# Patient Record
Sex: Female | Born: 1955
Health system: Southern US, Community
[De-identification: ages and names within clinical notes are randomized; demographics above are authoritative.]

## PROBLEM LIST (undated history)

## (undated) DIAGNOSIS — M1990 Unspecified osteoarthritis, unspecified site: Secondary | ICD-10-CM

## (undated) DIAGNOSIS — I639 Cerebral infarction, unspecified: Secondary | ICD-10-CM

## (undated) DIAGNOSIS — J449 Chronic obstructive pulmonary disease, unspecified: Secondary | ICD-10-CM

## (undated) HISTORY — PX: ECTOPIC PREGNANCY SURGERY: SHX613

## (undated) HISTORY — DX: Chronic obstructive pulmonary disease, unspecified: J44.9

## (undated) HISTORY — DX: Unspecified osteoarthritis, unspecified site: M19.90

---

## 1998-10-14 ENCOUNTER — Emergency Department (HOSPITAL_COMMUNITY): Admission: EM | Admit: 1998-10-14 | Discharge: 1998-10-14 | Payer: Self-pay | Admitting: Emergency Medicine

## 1998-10-14 ENCOUNTER — Encounter: Payer: Self-pay | Admitting: Emergency Medicine

## 1999-04-12 ENCOUNTER — Other Ambulatory Visit: Admission: RE | Admit: 1999-04-12 | Discharge: 1999-04-12 | Payer: Self-pay | Admitting: Obstetrics and Gynecology

## 1999-04-20 ENCOUNTER — Encounter: Payer: Self-pay | Admitting: Obstetrics and Gynecology

## 1999-04-23 ENCOUNTER — Ambulatory Visit (HOSPITAL_COMMUNITY): Admission: RE | Admit: 1999-04-23 | Discharge: 1999-04-23 | Payer: Self-pay | Admitting: Obstetrics and Gynecology

## 1999-04-30 ENCOUNTER — Ambulatory Visit (HOSPITAL_COMMUNITY): Admission: RE | Admit: 1999-04-30 | Discharge: 1999-04-30 | Payer: Self-pay | Admitting: Surgery

## 1999-04-30 ENCOUNTER — Encounter: Payer: Self-pay | Admitting: Obstetrics and Gynecology

## 2007-12-26 ENCOUNTER — Emergency Department (HOSPITAL_COMMUNITY): Admission: EM | Admit: 2007-12-26 | Discharge: 2007-12-26 | Payer: Self-pay | Admitting: Emergency Medicine

## 2010-06-19 ENCOUNTER — Emergency Department (HOSPITAL_COMMUNITY): Admission: EM | Admit: 2010-06-19 | Discharge: 2010-06-19 | Payer: Self-pay | Admitting: Emergency Medicine

## 2010-08-12 ENCOUNTER — Emergency Department (HOSPITAL_COMMUNITY): Admission: EM | Admit: 2010-08-12 | Discharge: 2010-08-12 | Payer: Self-pay | Admitting: Emergency Medicine

## 2011-01-11 LAB — WOUND CULTURE

## 2011-01-30 ENCOUNTER — Emergency Department (HOSPITAL_COMMUNITY): Payer: Medicaid Other

## 2011-01-30 ENCOUNTER — Emergency Department (HOSPITAL_COMMUNITY)
Admission: EM | Admit: 2011-01-30 | Discharge: 2011-01-30 | Disposition: A | Discharge status: Home / Self Care | Payer: Medicaid Other | Attending: Emergency Medicine | Admitting: Emergency Medicine

## 2011-01-30 DIAGNOSIS — M25579 Pain in unspecified ankle and joints of unspecified foot: Secondary | ICD-10-CM | POA: Insufficient documentation

## 2011-01-30 DIAGNOSIS — Z8679 Personal history of other diseases of the circulatory system: Secondary | ICD-10-CM | POA: Insufficient documentation

## 2011-01-30 DIAGNOSIS — W010XXA Fall on same level from slipping, tripping and stumbling without subsequent striking against object, initial encounter: Secondary | ICD-10-CM | POA: Insufficient documentation

## 2011-01-30 DIAGNOSIS — R079 Chest pain, unspecified: Secondary | ICD-10-CM | POA: Insufficient documentation

## 2011-01-30 DIAGNOSIS — S93409A Sprain of unspecified ligament of unspecified ankle, initial encounter: Secondary | ICD-10-CM | POA: Insufficient documentation

## 2011-09-09 ENCOUNTER — Emergency Department (HOSPITAL_COMMUNITY)
Admission: EM | Admit: 2011-09-09 | Discharge: 2011-09-09 | Disposition: A | Discharge status: Home / Self Care | Payer: Medicaid Other | Attending: Emergency Medicine | Admitting: Emergency Medicine

## 2011-09-09 DIAGNOSIS — M25529 Pain in unspecified elbow: Secondary | ICD-10-CM | POA: Insufficient documentation

## 2011-09-09 DIAGNOSIS — Z8673 Personal history of transient ischemic attack (TIA), and cerebral infarction without residual deficits: Secondary | ICD-10-CM | POA: Insufficient documentation

## 2011-09-09 DIAGNOSIS — M79609 Pain in unspecified limb: Secondary | ICD-10-CM | POA: Insufficient documentation

## 2011-09-09 DIAGNOSIS — R209 Unspecified disturbances of skin sensation: Secondary | ICD-10-CM | POA: Insufficient documentation

## 2011-09-09 DIAGNOSIS — Z79899 Other long term (current) drug therapy: Secondary | ICD-10-CM | POA: Insufficient documentation

## 2011-09-09 DIAGNOSIS — R202 Paresthesia of skin: Secondary | ICD-10-CM

## 2011-09-09 HISTORY — DX: Cerebral infarction, unspecified: I63.9

## 2011-09-09 MED ORDER — PREDNISONE 10 MG PO TABS: 20.0000 mg | ORAL_TABLET | Freq: Every day | ORAL | Status: AC

## 2011-09-09 MED ORDER — PREDNISONE 20 MG PO TABS
40.0000 mg | ORAL_TABLET | Freq: Once | ORAL | Status: AC
  Administered 2011-09-09: 40 mg via ORAL
  Filled 2011-09-09: qty 2

## 2011-09-09 NOTE — ED Provider Notes (Signed)
History     CSN: [id number] Arrival date & time: 09/09/2011  4:59 PM   First MD Initiated Contact with Patient 09/09/11 1749      Chief Complaint  Patient presents with  . Numbness    (Consider location/radiation/quality/duration/timing/severity/associated sxs/prior treatment) HPI Comments: Patient present today with episodic right middle finger numbness and tingling.  She states that this morning she awoke and her entire hand was numb and she had difficulty opening and closing her hand.  She states that she fell 1 week ago landing on her right elbow.  States symptoms started after that, has seen PCP but has not been taking anything.  Patient is a 55 y.o. female presenting with hand pain.  Hand Pain This is a new problem. The current episode started in the past 7 days. The problem occurs intermittently. The problem has been waxing and waning. Associated symptoms include numbness. Pertinent negatives include no abdominal pain, arthralgias, chest pain, congestion, diaphoresis, fever, headaches, joint swelling, nausea, sore throat, urinary symptoms, vertigo, vomiting or weakness. The symptoms are aggravated by nothing. She has tried nothing for the symptoms.    Past Medical History  Diagnosis Date  . Stroke     Past Surgical History  Procedure Date  . Ectopic pregnancy surgery     History reviewed. No pertinent family history.  History  Substance Use Topics  . Smoking status: Current Everyday Smoker -- 0.5 packs/day  . Smokeless tobacco: Not on file  . Alcohol Use: Yes    OB History    Grav Para Term Preterm Abortions TAB SAB Ect Mult Living                  Review of Systems  Constitutional: Negative.  Negative for fever and diaphoresis.  HENT: Negative.  Negative for congestion and sore throat.   Eyes: Negative.   Respiratory: Negative.   Cardiovascular: Negative.  Negative for chest pain.  Gastrointestinal: Negative.  Negative for nausea, vomiting and abdominal  pain.  Genitourinary: Negative.   Musculoskeletal: Negative.  Negative for joint swelling and arthralgias.  Skin: Negative.   Neurological: Positive for numbness. Negative for vertigo, weakness and headaches.  Hematological: Negative.   Psychiatric/Behavioral: Negative.     Allergies  Review of patient's allergies indicates no known allergies.  Home Medications   Current Outpatient Rx  Name Route Sig Dispense Refill  . BUPRENORPHINE HCL-NALOXONE HCL 8-2 MG SL SUBL Sublingual Place 1 tablet under the tongue 2 (two) times daily.      Marland Kitchen GABAPENTIN 300 MG PO CAPS Oral Take 300 mg by mouth 3 (three) times daily.        BP 108/71  Pulse 76  Temp(Src) 98.1 F (36.7 C) (Oral)  Resp 20  SpO2 97%  Physical Exam  Nursing note and vitals reviewed. Constitutional: She appears well-developed and well-nourished.  HENT:  Head: Normocephalic and atraumatic.  Mouth/Throat: Oropharynx is clear and moist.  Eyes: Pupils are equal, round, and reactive to light.  Neck: Normal range of motion. Neck supple. No spinous process tenderness and no muscular tenderness present.  Cardiovascular: Normal rate and regular rhythm.   Pulmonary/Chest: Effort normal and breath sounds normal.  Abdominal: Soft. Bowel sounds are normal. There is no tenderness.  Musculoskeletal:       Right elbow: She exhibits normal range of motion, no swelling and no deformity. tenderness found.  Neurological: She has normal strength. No sensory deficit.    ED Course  Procedures (including critical care time)  Labs  Reviewed - No data to display No results found.  Transient sensory changes   MDM  Patient here with right hand numbness and tingling s/p striking right elbow, as the symptoms are transient, feel may be nerve irritation.        Izola Price Rapelje, [place] 09/09/11 1816

## 2011-09-09 NOTE — ED Notes (Signed)
Patient presents with intermittant right arm numbness after falling on arm 2 weeks ago. Patient is a hemiplegic to left side.

## 2011-09-09 NOTE — ED Notes (Signed)
Pt to ED for eval of right hand numbness/tingling/weakness. Pt states that ever since she fell on her elbow last week she has had intermittent tingling to right middle finger. States today she had numbness/tingling to entire right hand and states it was difficult to close and open her hand. At this time pt is able to open and close fist without difficulty.

## 2011-09-11 NOTE — ED Provider Notes (Signed)
Evaluation and management procedures were performed by the PA/NP under my supervision/collaboration.    Felisa Bonier, MD 09/11/11 [phone]

## 2011-12-26 ENCOUNTER — Ambulatory Visit (INDEPENDENT_AMBULATORY_CARE_PROVIDER_SITE_OTHER): Payer: Medicaid Other | Admitting: Gastroenterology

## 2011-12-26 DIAGNOSIS — B182 Chronic viral hepatitis C: Secondary | ICD-10-CM

## 2011-12-27 LAB — CBC WITH DIFFERENTIAL/PLATELET
Basophils Absolute: 0 10*3/uL (ref 0.0–0.1)
Basophils Relative: 0 % (ref 0–1)
Eosinophils Absolute: 0.1 10*3/uL (ref 0.0–0.7)
Eosinophils Relative: 2 % (ref 0–5)
HCT: 38.2 % (ref 36.0–46.0)
Hemoglobin: 13.1 g/dL (ref 12.0–15.0)
Lymphocytes Relative: 23 % (ref 12–46)
Lymphs Abs: 1.6 10*3/uL (ref 0.7–4.0)
MCH: 29.6 pg (ref 26.0–34.0)
MCHC: 34.3 g/dL (ref 30.0–36.0)
MCV: 86.2 fL (ref 78.0–100.0)
Monocytes Absolute: 0.4 10*3/uL (ref 0.1–1.0)
Monocytes Relative: 6 % (ref 3–12)
Neutro Abs: 5 10*3/uL (ref 1.7–7.7)
Neutrophils Relative %: 69 % (ref 43–77)
Platelets: 141 10*3/uL — ABNORMAL LOW (ref 150–400)
RBC: 4.43 MIL/uL (ref 3.87–5.11)
RDW: 13 % (ref 11.5–15.5)
WBC: 7.2 10*3/uL (ref 4.0–10.5)

## 2011-12-27 LAB — AFP TUMOR MARKER: AFP-Tumor Marker: 6.7 ng/mL (ref 0.0–8.0)

## 2011-12-27 LAB — PROTIME-INR
INR: 1.12 (ref <1.50)
Prothrombin Time: 14.9 seconds (ref 11.6–15.2)

## 2011-12-27 LAB — TSH: TSH: 0.986 u[IU]/mL (ref 0.350–4.500)

## 2011-12-27 LAB — HEPATITIS A ANTIBODY, TOTAL: Hep A Total Ab: POSITIVE — AB

## 2011-12-27 LAB — HEPATITIS B SURFACE ANTIBODY,QUALITATIVE

## 2011-12-27 LAB — ANA: Anti Nuclear Antibody(ANA): NEGATIVE

## 2011-12-27 LAB — HEPATITIS B SURFACE ANTIGEN: Hepatitis B Surface Ag: NEGATIVE

## 2011-12-27 LAB — HEPATITIS B CORE ANTIBODY, TOTAL: Hep B Core Total Ab: POSITIVE — AB

## 2011-12-27 LAB — FERRITIN: Ferritin: 192 ng/mL (ref 10–291)

## 2011-12-28 LAB — COMPLETE METABOLIC PANEL WITH GFR
ALT: 75 U/L — ABNORMAL HIGH (ref 0–35)
AST: 97 U/L — ABNORMAL HIGH (ref 0–37)
Albumin: 4.1 g/dL (ref 3.5–5.2)
Alkaline Phosphatase: 68 U/L (ref 39–117)
BUN: 15 mg/dL (ref 6–23)
CO2: 24 mEq/L (ref 19–32)
Calcium: 9.1 mg/dL (ref 8.4–10.5)
Chloride: 104 mEq/L (ref 96–112)
Creat: 0.67 mg/dL (ref 0.50–1.10)
GFR, Est African American: >89 mL/min
GFR, Est Non African American: >89 mL/min
Glucose, Bld: 89 mg/dL (ref 70–99)
Potassium: 3.6 mEq/L (ref 3.5–5.3)
Sodium: 140 mEq/L (ref 135–145)
Total Bilirubin: 0.5 mg/dL (ref 0.3–1.2)
Total Protein: 7.5 g/dL (ref 6.0–8.3)

## 2011-12-28 LAB — IRON AND TIBC
%SAT: 43 % (ref 20–55)
Iron: 125 ug/dL (ref 42–145)
TIBC: 294 ug/dL (ref 250–470)
UIBC: 169 ug/dL (ref 125–400)

## 2012-01-01 LAB — HEPATITIS C GENOTYPE

## 2012-01-02 NOTE — Progress Notes (Signed)
Dana Knapp, Dana Knapp    MR#:  [id number]      DATE:  12/26/2011  DOB:  May 28, 1956    cc: Primary care physician:  Same Referring physician:  Simone Curia, MD, [facility], [address], [place], [place] [address], Fax [phone]    Reason for referral:  Hepatitis C antibody positive.   History:  The patient is a 56 year old woman who I have been asked to see in consultation, by Dr. Nedra Knapp, regarding a positive hepatitis C antibody.  According to the patient, she was first found to be hepatitis C positive sometime in 2011, while being followed by a physician in [facility]. She recalls being sent to a woman physician, which was possibly  Dr. Karena Knapp at [facility]. I do not have any records regarding this, but the patient reports she was told that there was no indication for treatment. Again, I have no records  regarding this. She cannot recall her genotype or being biopsied. She has now switched physicians to [facility], who want her seen here. There are no symptoms referable to her history  of hepatitis C, nor are there symptoms to suggest decompensated or cryoglobulin mediated disease.  With respect to risk factors for liver disease, she describes herself as a weekend drinker, perhaps drinking once per week, but none in the last 5 months. 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 She stopped using in October 2012, where she was started on Suboxone. She continues to follow up with Dr. Nedra Knapp for Suboxone and has drug screens through their office. She reports that because she has  been negative on a weekly basis, she is due to move to biweekly drug testing. There is no history of tattoos or unsterile body piercing or blood transfusion prior to 1992. Her family history is significant for  a brother who had hepatitis C and was possibly treated in our clinic. She has not been vaccinated  against hepatitis A or B.   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 This may have been during an episode drug use. There is no history of coronary disease, diabetes, dyslipidemia, dysthyroidism.   PAST SURGICAL HISTORY:  Ectopic pregnancy.   Past psychiatric history:  Denies.   CURRENT MEDICATIONS:  Neurontin dose unknown, Suboxone 8-2 mg Film b.i.d.    Allergies:  Denies.    Habits:  Smoking 3/4 pack cigarettes per day. Alcohol as above.    Family history:  As above.    Social history:  Disabled secondary to stroke. She is widowed without any children.   REVIEW OF SYSTEMS:  Significant for left hemiplegia. All 10 systems reviewed today on the review of systems form, which was signed and placed the chart. CES-D was not completed.   PHYSICAL EXAMINATION:   Constitutional:  Appears stated age. Vital signs: Height 63 inches, weight 124 pounds, blood pressure 143/95, pulse 71, temperature 97.5 Fahrenheit. Ears, nose, mouth, and throat: Unremarkable oropharynx. No  thyromegaly or neck masses. Chest: Resonant to percussion. Clear to auscultation.  CARDIOVASCULAR:  Normal S1, S2. No murmurs, rubs.   EXTREMITIES:  There is no peripheral edema.  ABDOMINAL EXAM:  Normal bowel sounds. No masses or tenderness. I could not appreciate liver edge or spleen tip. Lymphatics: No cervical or inguinal lymphadenopathy.   CENTRAL NERVOUS SYSTEM:  No asterixis on the right hand. There is left hemiplegia from the  previous stroke.   DERMATOLOGIC:  Anicteric. No palmar erythema. Eyes: Anicteric sclera. Pupils were equal reactive to light.   LABORATORY STUDIES:  From 08/15/2011; CBC, white count 4.8, hemoglobin 13.8, MCV 90.5, platelet count 101. Creatinine was 0.62, albumin 4.3, globulins 3.4, total bilirubin 0.4, AST of 63, ALT 85, ALP 79  On 08/26/2011; Her hepatitis C antibody was positive, the B surface antigen was negative.    On 08/22/2011; albumin 4.1, globulin 3.1, total bilirubin 0.7, ALP 88, AST 65, ALT 62.   On 08/15/2011, TSH 1.04.   ASSESSMENT:  The patient is a 56 year old woman with a history of a positive hepatitis C antibody. She is clinically and biochemically well compensated however, I note that her CBC obtained 08/15/2011, showed a  platelet count of 101,000. Will need to repeat her CBC to see if she has thrombocytopenia to suggest cirrhosis or not. Otherwise, I diddo not see any contraindication to treating her for hepatitis C.  In my discussion today with the patient, we discussed the need to genotype her. We discussed the significance of genotyping her. We discussed the biopsy for genotype 1. Discussed treatment with  pegylated interferon and ribavirin for all genotypes and the addition of a protease inhibitor for genotype 1. We discussed the specific system, constitutional, psychiatric side effects of therapy. I  discussed response rates. I discussed our treatment protocol. I discussed the risks of contagion. Having said all this, the patient remains interested in being treated.   plan:  1. Standard labs. 2. HCV genotype. 3. Test hepatitis B surface antigen, and for hepatitis A and B immunity. 4. If genotype 1, proceed with a liver biopsy in the coming weeks, and follow up thereafter. 5. Genotype 2 or 3, consider going directly to therapy. 6. Literature on hepatitis C given.            Dana Dare, MD   ADDENDUM Genotype 3a.     Hep A positive.  Hep B surf Ab indeterminate.  Will consider it positive.   Plts 141.  Will complete the prior authorization for Peg/RBV with Medicaid.  403 .[id number]  D:  Thu Feb 28 20:18:44 2013 ; T:  Thu Feb 28 21:59:06 2013  Job #:  [id number]

## 2012-01-16 ENCOUNTER — Telehealth: Payer: Self-pay | Admitting: Gastroenterology

## 2012-01-16 NOTE — Telephone Encounter (Signed)
Called to [facility] and left a message to call back about her Pegasys prior authorization.

## 2012-01-31 ENCOUNTER — Telehealth: Payer: Self-pay | Admitting: Gastroenterology

## 2012-01-31 NOTE — Telephone Encounter (Signed)
Faxed orders for Pegasys 180 mcg weekly/Ribavirin 400 mg BID for six months.  Medicaid authorized Pegasys on 01/20/12.

## 2012-02-13 ENCOUNTER — Telehealth: Payer: Self-pay | Admitting: Gastroenterology

## 2012-02-13 NOTE — Telephone Encounter (Signed)
Faxed to [facility] to ask about status of meds.  Also asking Tobi Bastos to call.

## 2012-02-18 ENCOUNTER — Telehealth: Payer: Self-pay

## 2012-02-20 ENCOUNTER — Telehealth: Payer: Self-pay | Admitting: Gastroenterology

## 2012-02-20 NOTE — Telephone Encounter (Signed)
When I asked Dana Knapp to call [facility] to investigate the status of Dana Knapp meds, she discovered that [name] had contacted the patient.  Dana Knapp's note: "I spoke with patient and she stated that she always has low grade fevers and flu like symtoms. She told this to [facility] drug and they stated that they will have to confirm this with Dr. Jacqualine Knapp before they mail her the medicine."  I have yet to hear from [facility].  I called the patient and left a message to investigate.  I have also faxed this message to [facility] asking them to call me at [phone].

## 2012-03-12 ENCOUNTER — Ambulatory Visit: Payer: Medicaid Other | Admitting: Gastroenterology

## 2012-03-19 ENCOUNTER — Ambulatory Visit (INDEPENDENT_AMBULATORY_CARE_PROVIDER_SITE_OTHER): Payer: Medicaid Other | Admitting: Gastroenterology

## 2012-03-19 DIAGNOSIS — B182 Chronic viral hepatitis C: Secondary | ICD-10-CM

## 2012-03-19 MED ORDER — PROMETHAZINE HCL 25 MG PO TABS: 25.0000 mg | ORAL_TABLET | Freq: Four times a day (QID) | ORAL | Status: AC | PRN

## 2012-03-23 LAB — HEPATITIS C RNA QUANTITATIVE
HCV Quantitative Log: 5.9 {Log} — ABNORMAL HIGH (ref <1.63)
HCV Quantitative: 795345 IU/mL — ABNORMAL HIGH (ref <43)

## 2012-03-26 NOTE — Progress Notes (Addendum)
Dana Knapp, DAIN    MR#:  [id number]      DATE:  03/19/2012  DOB:  [birth year]    cc: Referring physician: Simone Curia, MD, [facility], [address] [place], [place] [address], Fax [phone]    REASON FOR VISIT: Followup of genotype 3a hepatitis C, initiation of therapy.   HISTORY: The patient returns today accompanied by her niece and her mother, who will assist in dosing of her medications. She currently has no symptoms referable to her history of hepatitis C or symptoms to suggest cryoglobulin mediated or decompensated liver disease.   It should be noted that when she was contacted to ship her medication, she had told the pharmacist at [facility] that she had night sweats. Today, she reports she had 2 weeks of temperatures ranging around 100 and as much as 102 degrees Fahrenheit, which has now resolved in the last month. She has had a 6-pound intentional weight loss, but no constitutional symptoms otherwise.   PAST MEDICAL HISTORY: No interval change.   CURRENT MEDICATIONS:  1. Neurontin, dose unknown. 2. Suboxone 852 mg film b.i.d.   ALLERGIES: Denies.   HABITS: Smoking: 3/4 pack of cigarettes per day. Alcohol: Denies interval consumption.   REVIEW OF SYSTEMS: All 10 systems reviewed today with the patient and they are negative other than which was mentioned above. Her CES-D was 15.   PHYSICAL EXAMINATION: Constitutional: Left hemiplegia as before. Vital signs: Height 62 inches, weight 118 pounds, blood pressure 146/89, pulse of 70, temperature 98.0 Fahrenheit.   ASSESSMENT: The patient is a 56 year old woman with a history of genotype 3a hepatitis C, who is clinically and biochemically well compensated. I will initiate therapy with a combination of pegylated interferon and ribavirin.   In my discussion today with the patient and her family who accompanied her, we discussed the dosing of Pegasys and ribavirin. I have explained how  to use the Pegasys preloaded syringe. We discussed side effect management. We discussed the importance of continued followup with lab testing.   PLAN:  1. Hepatitis A and B immune.  Baseline HCV RNA today.  2. Start on Pegasys and ribavirin on 03/20/2012.  3. Return in 2 weeks' time in followup for week 2 visit.  4. I have given a prescription for Phenergan 25 mg every 6 hours p.r.n. for nausea, 30 tablets, 11 refills, in anticipation of nausea that she may experience on therapy at her request.               [name], MD ADDENDUM  Baseline HCV RNA  213086 IU/mL  5.9 log IU/mL    [phone] [phone]  D:  Thu May 23 20:57:50 2013 ; T:  Fri May 24 11:50:15 2013  Job #:  [id number]

## 2012-04-02 ENCOUNTER — Ambulatory Visit (INDEPENDENT_AMBULATORY_CARE_PROVIDER_SITE_OTHER): Payer: Medicaid Other | Admitting: Gastroenterology

## 2012-04-02 DIAGNOSIS — B182 Chronic viral hepatitis C: Secondary | ICD-10-CM

## 2012-04-02 LAB — CBC WITH DIFFERENTIAL/PLATELET
Basophils Absolute: 0 10*3/uL (ref 0.0–0.1)
Basophils Relative: 0 % (ref 0–1)
Eosinophils Absolute: 0.1 10*3/uL (ref 0.0–0.7)
Eosinophils Relative: 1 % (ref 0–5)
HCT: 36.1 % (ref 36.0–46.0)
Hemoglobin: 12.2 g/dL (ref 12.0–15.0)
Lymphocytes Relative: 25 % (ref 12–46)
Lymphs Abs: 1.3 10*3/uL (ref 0.7–4.0)
MCH: 28.6 pg (ref 26.0–34.0)
MCHC: 33.8 g/dL (ref 30.0–36.0)
MCV: 84.5 fL (ref 78.0–100.0)
Monocytes Absolute: 0.4 10*3/uL (ref 0.1–1.0)
Monocytes Relative: 8 % (ref 3–12)
Neutro Abs: 3.3 10*3/uL (ref 1.7–7.7)
Neutrophils Relative %: 66 % (ref 43–77)
Platelets: 102 10*3/uL — ABNORMAL LOW (ref 150–400)
RBC: 4.27 MIL/uL (ref 3.87–5.11)
RDW: 13 % (ref 11.5–15.5)
WBC: 5 10*3/uL (ref 4.0–10.5)

## 2012-04-09 NOTE — Progress Notes (Signed)
  NAMEDENIS, KOPPEL    MR#:  [id number]      DATE:  04/02/2012  DOB:  1955-11-28    cc: Referring Physician: Simone Curia, MD, [facility], [address] [place], [place] [address], Fax [phone]    REASON FOR VISIT:  Follow up genotype 3a HCV, week 2 of therapy.   HISTORY:  The patient returns today accompanied by her mother who assists in administering her medications. There are no symptoms to suggest cryoglobulin mediated or decompensated liver disease. She has myalgias and fevers with the injections but she is tolerating these side effects well.   PAST MEDICAL HISTORY:  No interval change.   CURRENT MEDICATIONS:  1. Suboxone 852 mg film b.i.d.  2. Neurontin, dose unknown daily. 3. Pegasys 180 mcg weekly.  4. Ribavirin 400 mg b.i.d.   ALLERGIES:  Denies.   HABITS:  Smoking less than previous, now using electronic cigarettes. Alcohol denies interval consumption.   REVIEW OF SYSTEMS:  All 10 systems reviewed today with the patient and they are negative other than which was mentioned above. CES-D was 16.   PHYSICAL EXAMINATION:  Constitutional: Left hemiplegia as before. Vital Signs: Height 62 inches, weight 114 pounds, down 4 pounds from previously, blood pressure 130/90, pulse 75, temperature 98.1 Fahrenheit.   ASSESSMENT:  The patient is a 56 year old woman with history of genotype 3a hepatitis C, who is clinically and biochemically well compensated. She initiated therapy with pegylated interferon and ribavirin on 03/22/2012. She is experiencing the usual side effects.   In my discussion today with the patient and her family, we discussed side effect management. We discussed the significance of the week 4 HCV RNA.   PLAN:  1. Hepatitis A and B immune.  2. CBC today.  3. Return in 2 weeks' time for repeat CBC and HCV RNA for week 4 RNA.               [name], MD   [phone]  D:  Thu Jun 06 17:18:30 2013 ; TBettina Gavia 21:02:53 2013  Job #:  [id number]

## 2012-04-16 ENCOUNTER — Ambulatory Visit: Payer: Medicaid Other | Admitting: Gastroenterology

## 2012-05-14 ENCOUNTER — Ambulatory Visit (INDEPENDENT_AMBULATORY_CARE_PROVIDER_SITE_OTHER): Payer: Medicaid Other | Admitting: Gastroenterology

## 2012-05-14 DIAGNOSIS — B182 Chronic viral hepatitis C: Secondary | ICD-10-CM

## 2012-05-14 LAB — CBC WITH DIFFERENTIAL/PLATELET
Basophils Absolute: 0 10*3/uL (ref 0.0–0.1)
Basophils Relative: 0 % (ref 0–1)
Eosinophils Absolute: 0.1 10*3/uL (ref 0.0–0.7)
Eosinophils Relative: 3 % (ref 0–5)
HCT: 30.5 % — ABNORMAL LOW (ref 36.0–46.0)
Hemoglobin: 10.4 g/dL — ABNORMAL LOW (ref 12.0–15.0)
Lymphocytes Relative: 30 % (ref 12–46)
Lymphs Abs: 0.7 10*3/uL (ref 0.7–4.0)
MCH: 29.1 pg (ref 26.0–34.0)
MCHC: 34.1 g/dL (ref 30.0–36.0)
MCV: 85.4 fL (ref 78.0–100.0)
Monocytes Absolute: 0.2 10*3/uL (ref 0.1–1.0)
Monocytes Relative: 8 % (ref 3–12)
Neutro Abs: 1.4 10*3/uL — ABNORMAL LOW (ref 1.7–7.7)
Neutrophils Relative %: 59 % (ref 43–77)
Platelets: 71 10*3/uL — ABNORMAL LOW (ref 150–400)
RBC: 3.57 MIL/uL — ABNORMAL LOW (ref 3.87–5.11)
RDW: 15.9 % — ABNORMAL HIGH (ref 11.5–15.5)
WBC: 2.3 10*3/uL — ABNORMAL LOW (ref 4.0–10.5)

## 2012-05-14 LAB — HEPATIC FUNCTION PANEL
ALT: 47 U/L — ABNORMAL HIGH (ref 0–35)
AST: 70 U/L — ABNORMAL HIGH (ref 0–37)
Albumin: 3.6 g/dL (ref 3.5–5.2)
Alkaline Phosphatase: 81 U/L (ref 39–117)
Bilirubin, Direct: 0.2 mg/dL (ref 0.0–0.3)
Indirect Bilirubin: 0.4 mg/dL (ref 0.0–0.9)
Total Bilirubin: 0.6 mg/dL (ref 0.3–1.2)
Total Protein: 6.9 g/dL (ref 6.0–8.3)

## 2012-05-14 NOTE — Patient Instructions (Signed)
Call [phone] and ask for Lisabeth Pick for questions about Hepatitis C therapy.  Tell them you are a [facility] patient.

## 2012-05-18 LAB — HEPATITIS C RNA QUANTITATIVE: HCV Quantitative: NOT DETECTED IU/mL (ref <43)

## 2012-05-21 NOTE — Progress Notes (Signed)
   NAMELIANN, Dana Knapp    MR#:  [id number]      DATE:  05/14/2012  DOB:  [birth year]    cc: Referring Physician: Simone Curia, MD, [facility], [address] [place], [place] [address], Fax [phone]     REASON FOR VISIT:  Follow up of genotype 3a hepatitis C, week six of therapy.   HISTORY:  The patient returns today accompanied by her mother who assists in her administrating medications. She complains of fatigue and lack of motivation. They both note the nausea that she complained of previously, improves with Phenergan that I prescribed for her. Her mother describes temperatures up 102 degrees Fahrenheit, which may occur within a few days, but not necessary the next day of each injection of interferon. She denies any suicidal ideation. There is no violent outbursts.   PAST MEDICAL HISTORY:  No change.   CURRENT MEDICATIONS:  1. Suboxone 8/2 mg film b.i.d.  2. Neurontin, dose unknown daily.  3. Pegasys 180 mcg weekly.  4. Ribavirin 400 mg b.i.d.   ALLERGIES:  Denies.   HABITS:  Smoking seven cigarettes in three days on average. Alcohol: Denies interval consumption.   REVIEW OF SYSTEMS:  All 10 systems reviewed today with the patient, and they are negative other than which is mentioned above. CES-D was 39.   PHYSICAL EXAMINATION:  Constitutional: Left hemiplegia as before. Vital signs: Height 63 inches, weight 110 pounds, down four pounds from previous. Blood pressure 117/87, pulse 64, temperature 97.2 degrees Fahrenheit.   LABORATORIES:  Previous labs were reviewed. Last CBC was on 04/02/2012.   ASSESSMENT:  The patient is a 55 year old woman with history of genotype 3a hepatitis C who is clinically and biochemically well compensated. She initiated therapy with pegylated interferon and ribavirin on 03/22/2012. She is due to take her seventh injection of Pegasys on 05/17/2012 having initiated on pegylated interferon and ribavirin on  03/22/2012. She has experienced the usual side effects of therapy.   Today, I reassured her that the fevers are related to the use of interferon. We discussed the possibility of adding an SSRI because of her complaints of lack of motivation. After ascertaining that she denied any suicidal ideation and there have been no violent outbursts, she declined the SSRI, and I did not press the issue. We discussed obtaining updated lab work today.   PLAN:  1. Hepatitis A and B immune.  2. CBC with differential today.  3. Liver enzymes today.  4. Will check an HCV RNA today which will have to be week six as she missed her week four RNA blood draw.  5. Return in two weeks' time.             [name], MD   [facility]:  No detectable level of HCV RNA at week 6  [phone] .[phone]  D:  Thu Jul 18 19:14:28 2013 ; T:  Fri Jul 19 08:28:28 2013  Job #:  [id number]

## 2012-05-28 ENCOUNTER — Ambulatory Visit: Payer: Medicaid Other | Admitting: Gastroenterology

## 2012-06-04 LAB — CBC WITH DIFFERENTIAL/PLATELET
Basophils Absolute: 0 10*3/uL (ref 0.0–0.1)
Basophils Relative: 0 % (ref 0–1)
Eosinophils Absolute: 0 10*3/uL (ref 0.0–0.7)
Eosinophils Relative: 2 % (ref 0–5)
HCT: 30.5 % — ABNORMAL LOW (ref 36.0–46.0)
Hemoglobin: 10.1 g/dL — ABNORMAL LOW (ref 12.0–15.0)
Lymphocytes Relative: 23 % (ref 12–46)
Lymphs Abs: 0.6 10*3/uL — ABNORMAL LOW (ref 0.7–4.0)
MCH: 28.9 pg (ref 26.0–34.0)
MCHC: 33.1 g/dL (ref 30.0–36.0)
MCV: 87.1 fL (ref 78.0–100.0)
Monocytes Absolute: 0.2 10*3/uL (ref 0.1–1.0)
Monocytes Relative: 6 % (ref 3–12)
Neutro Abs: 1.7 10*3/uL (ref 1.7–7.7)
Neutrophils Relative %: 69 % (ref 43–77)
Platelets: 99 10*3/uL — ABNORMAL LOW (ref 150–400)
RBC: 3.5 MIL/uL — ABNORMAL LOW (ref 3.87–5.11)
RDW: 15.7 % — ABNORMAL HIGH (ref 11.5–15.5)
WBC: 2.5 10*3/uL — ABNORMAL LOW (ref 4.0–10.5)

## 2012-06-04 LAB — HEPATIC FUNCTION PANEL
ALT: 60 U/L — ABNORMAL HIGH (ref 0–35)
AST: 85 U/L — ABNORMAL HIGH (ref 0–37)
Albumin: 3.5 g/dL (ref 3.5–5.2)
Alkaline Phosphatase: 71 U/L (ref 39–117)
Bilirubin, Direct: 0.2 mg/dL (ref 0.0–0.3)
Indirect Bilirubin: 0.3 mg/dL (ref 0.0–0.9)
Total Bilirubin: 0.5 mg/dL (ref 0.3–1.2)
Total Protein: 6.9 g/dL (ref 6.0–8.3)

## 2012-06-25 ENCOUNTER — Ambulatory Visit (INDEPENDENT_AMBULATORY_CARE_PROVIDER_SITE_OTHER): Payer: Medicaid Other | Admitting: Gastroenterology

## 2012-06-25 DIAGNOSIS — B182 Chronic viral hepatitis C: Secondary | ICD-10-CM

## 2012-06-25 LAB — CBC WITH DIFFERENTIAL/PLATELET
Basophils Absolute: 0 10*3/uL (ref 0.0–0.1)
Basophils Relative: 0 % (ref 0–1)
Eosinophils Absolute: 0 10*3/uL (ref 0.0–0.7)
Eosinophils Relative: 2 % (ref 0–5)
HCT: 29.3 % — ABNORMAL LOW (ref 36.0–46.0)
Hemoglobin: 9.9 g/dL — ABNORMAL LOW (ref 12.0–15.0)
Lymphocytes Relative: 28 % (ref 12–46)
Lymphs Abs: 0.6 10*3/uL — ABNORMAL LOW (ref 0.7–4.0)
MCH: 29.6 pg (ref 26.0–34.0)
MCHC: 33.8 g/dL (ref 30.0–36.0)
MCV: 87.7 fL (ref 78.0–100.0)
Monocytes Absolute: 0.2 10*3/uL (ref 0.1–1.0)
Monocytes Relative: 9 % (ref 3–12)
Neutro Abs: 1.2 10*3/uL — ABNORMAL LOW (ref 1.7–7.7)
Neutrophils Relative %: 61 % (ref 43–77)
Platelets: 74 10*3/uL — ABNORMAL LOW (ref 150–400)
RBC: 3.34 MIL/uL — ABNORMAL LOW (ref 3.87–5.11)
RDW: 15.3 % (ref 11.5–15.5)
WBC: 2 10*3/uL — ABNORMAL LOW (ref 4.0–10.5)

## 2012-06-25 LAB — HEPATIC FUNCTION PANEL
ALT: 55 U/L — ABNORMAL HIGH (ref 0–35)
AST: 84 U/L — ABNORMAL HIGH (ref 0–37)
Albumin: 3.6 g/dL (ref 3.5–5.2)
Alkaline Phosphatase: 83 U/L (ref 39–117)
Bilirubin, Direct: 0.1 mg/dL (ref 0.0–0.3)
Indirect Bilirubin: 0.4 mg/dL (ref 0.0–0.9)
Total Bilirubin: 0.5 mg/dL (ref 0.3–1.2)
Total Protein: 6.9 g/dL (ref 6.0–8.3)

## 2012-06-25 NOTE — Patient Instructions (Signed)
To get a [facility] medical record number call [phone] and press option 3

## 2012-07-09 NOTE — Progress Notes (Signed)
   Dana Knapp, Dana Knapp    MR#:  [id number]      DATE:  06/25/2012  DOB:  19-Sep-1956    cc:     REFERRING PHYSICIAN:   Simone Curia, MD, [facility] - [facility], [address], [place], [place] [address], fax [phone].  PRIMARY CARE PHYSICIAN:   Simone Curia, MD, [facility] - Internal Medicine, [address], [place], [place] [address], fax [phone].  REASON FOR VISIT:  Follow up genotype 3A hepatitis C, week 13 of therapy.   HISTORY:  The patient returns today accompanied by her mother. She missed her 05/28/2012 approximately week 8 appointment but presented the next week on 06/04/2012 accidentally and I had her draw labs at that time. There are no new complaints related to treatment of her hepatitis C. There are no symptoms to suggest cryoglobulin mediated or decompensated liver disease.   PAST MEDICAL HISTORY:  No interval change.   CURRENT MEDICATIONS:  Suboxone 8/2 mg film b.i.d., Neurontin dose unknown daily, Pegasys 180 mcg weekly, Ribavirin 400 mg b.i.d.   ALLERGIES:  Denies.   HABITS:  Smoking the electronic cigarettes now. Alcohol:  Denies interval consumption.   REVIEW OF SYSTEMS:  All 10 systems reviewed today with the patient and her mother and they are negative other than which is mentioned above. Her CES-D was 41.   PHYSICAL EXAMINATION:  Constitutional: Well appearing. Vital Signs: Height 63 inches, weight 108 pounds, blood pressure 123/89, pulse of 87, temperature 98.8 Fahrenheit.   LABORATORIES:  Previous labs from 06/04/2012, AST was 85, ALT 60, ALP 71, total bilirubin 0.5, albumin of 3.5. CBC: White count 2.5, hemoglobin 10.1, MCV 87.1, platelets 99 and ANC 1.7.   ASSESSMENT:  The patient is a 56 year old woman with history of genotype 3A hepatitis C was clinically and biochemically well compensated. She initiated therapy with pegylated interferon and ribavirin on 03/22/2012. She is due to take her  fourteenth injection of Pegasys on 06/28/2012. Her week 6 HCV RNA was undetectable as she missed her week 4 blood draw.   Today I reassured her and her mother that she is progressing well on therapy. We discussed the fact that liver enzymes remain elevated and the significance thereof. I explained to her we will to see what happens with subsequent determinations.  PLAN:  1. Hepatitis A and B immune.  2. CBC with differential and liver enzymes today.  3. She is to return in approximately 3 weeks' time to bring her to around week 16 of therapy.               [name], MD   [phone]  D:  Thu Aug 29 18:31:28 2013 ; T:  Caleen Essex Aug 30 09:09:09 2013  Job #:  [id number]

## 2012-07-16 ENCOUNTER — Ambulatory Visit (INDEPENDENT_AMBULATORY_CARE_PROVIDER_SITE_OTHER): Payer: Medicaid Other | Admitting: Gastroenterology

## 2012-07-16 DIAGNOSIS — B182 Chronic viral hepatitis C: Secondary | ICD-10-CM

## 2012-07-16 LAB — HEPATIC FUNCTION PANEL
ALT: 65 U/L — ABNORMAL HIGH (ref 0–35)
AST: 103 U/L — ABNORMAL HIGH (ref 0–37)
Albumin: 3.5 g/dL (ref 3.5–5.2)
Alkaline Phosphatase: 75 U/L (ref 39–117)
Bilirubin, Direct: 0.1 mg/dL (ref 0.0–0.3)
Indirect Bilirubin: 0.4 mg/dL (ref 0.0–0.9)
Total Bilirubin: 0.5 mg/dL (ref 0.3–1.2)
Total Protein: 6.6 g/dL (ref 6.0–8.3)

## 2012-07-17 LAB — CBC WITH DIFFERENTIAL/PLATELET
Basophils Absolute: 0 10*3/uL (ref 0.0–0.1)
Basophils Relative: 0 % (ref 0–1)
Eosinophils Absolute: 0 10*3/uL (ref 0.0–0.7)
Eosinophils Relative: 1 % (ref 0–5)
HCT: 30.4 % — ABNORMAL LOW (ref 36.0–46.0)
Hemoglobin: 10.1 g/dL — ABNORMAL LOW (ref 12.0–15.0)
Lymphocytes Relative: 30 % (ref 12–46)
Lymphs Abs: 0.5 10*3/uL — ABNORMAL LOW (ref 0.7–4.0)
MCH: 29.5 pg (ref 26.0–34.0)
MCHC: 33.2 g/dL (ref 30.0–36.0)
MCV: 88.9 fL (ref 78.0–100.0)
Monocytes Absolute: 0.1 10*3/uL (ref 0.1–1.0)
Monocytes Relative: 9 % (ref 3–12)
Neutro Abs: 1 10*3/uL — ABNORMAL LOW (ref 1.7–7.7)
Neutrophils Relative %: 60 % (ref 43–77)
Platelets: 61 10*3/uL — ABNORMAL LOW (ref 150–400)
RBC: 3.42 MIL/uL — ABNORMAL LOW (ref 3.87–5.11)
RDW: 14.8 % (ref 11.5–15.5)
WBC: 1.6 10*3/uL — ABNORMAL LOW (ref 4.0–10.5)

## 2012-07-27 NOTE — Progress Notes (Signed)
  NAMEANTOINETTA, Dana Knapp    MR#:  [id number]      DATE:  07/16/2012  DOB:  [birth year]         referring physician:   Simone Curia, MD, [facility] - Internal Medicine, [address] [place], [place] [address], fax [phone].  primary care physician: Simone Curia, MD, [facility] - Internal Medicine, [address] [place], [place] [address], fax [phone].  REASON FOR VISIT:   Followup of genotype 3A hepatitis C, week 16 of therapy.   HISTORY:  The patient returns today accompanied by her mother. She continues on therapy without any complaints related to treatment. There are no symptoms to suggest cryoglobulin mediated or decompensated liver disease. There were no significant depressive symptoms.   PAST MEDICAL HISTORY:  No interval change.   CURRENT MEDICATIONS:  1. Suboxone 8/2 mg film b.i.d.  2. Neurontin, dose unknown daily.  3. Pegasys 180 mcg weekly.  4. Ribavirin 400 mg b.i.d.   ALLERGIES:  Denies.   HABITS:  Smoking:  Has resumed smoking having unsuccessfully quit. Alcohol:  Denies interval consumption.   REVIEW OF SYSTEMS:  All 10 systems reviewed today with the patient and her mother and they are negative other than which is mentioned above. Her CES-D was 30.   PHYSICAL EXAMINATION:  Constitutional: Well appearing. Vital signs: Height 63 inches, weight 111 pounds, blood pressure 111/83, pulse 75, temperature 97.9 Fahrenheit.   LABORATORIES:  Previous laboratory work was reviewed.   ASSESSMENT:  The patient is a 56 year old woman with a history of genotype 3A hepatitis C who is clinically and biochemically well compensated. She initiated therapy with pegylated interferon and ribavirin on 03/22/2012. She is due to take her 16th injection of Pegasys in the next few days. Although a week 4 HCV RNA was not done on time, her week 6 HCV RNA was undetectable.   Today we discussed the plan to continue on therapy for 6 months in  total.   PLAN:  1. Hepatitis A and B immune.  2. CBC with differential and liver enzymes today.  3. Return to in approximately 4 weeks' time in [place].              [name], MD   [phone]  D:  Thu Sep 19 17:55:11 2013 ; T:  Caleen Essex Sep 20 09:44:24 2013  Job #:  [id number]

## 2012-08-05 ENCOUNTER — Other Ambulatory Visit: Payer: Self-pay | Admitting: Gastroenterology

## 2014-06-21 ENCOUNTER — Other Ambulatory Visit (HOSPITAL_COMMUNITY): Payer: Self-pay | Admitting: Family Medicine

## 2014-06-21 ENCOUNTER — Ambulatory Visit (HOSPITAL_COMMUNITY)
Admission: RE | Admit: 2014-06-21 | Discharge: 2014-06-21 | Disposition: A | Discharge status: Home / Self Care | Payer: Medicaid Other | Source: Ambulatory Visit | Attending: Family Medicine | Admitting: Family Medicine

## 2014-06-21 DIAGNOSIS — J449 Chronic obstructive pulmonary disease, unspecified: Secondary | ICD-10-CM

## 2014-06-21 DIAGNOSIS — J438 Other emphysema: Secondary | ICD-10-CM | POA: Insufficient documentation

## 2014-09-19 ENCOUNTER — Other Ambulatory Visit: Payer: Self-pay | Admitting: Orthopaedic Surgery

## 2014-09-19 DIAGNOSIS — M545 Low back pain: Secondary | ICD-10-CM

## 2014-10-07 ENCOUNTER — Ambulatory Visit
Admission: RE | Admit: 2014-10-07 | Discharge: 2014-10-07 | Disposition: A | Discharge status: Home / Self Care | Payer: Medicaid Other | Source: Ambulatory Visit | Attending: Orthopaedic Surgery | Admitting: Orthopaedic Surgery

## 2014-10-07 DIAGNOSIS — M545 Low back pain: Secondary | ICD-10-CM

## 2015-11-09 MED FILL — SUBOXONE 4 MG-1 MG SL FILM: 4-1 | 30 days supply | Qty: 30 | Fill #0

## 2015-11-09 MED FILL — SUBOXONE 8 MG-2 MG SL FILM: 8-2 | 30 days supply | Qty: 60 | Fill #0

## 2015-12-08 MED FILL — SUBOXONE 4 MG-1 MG SL FILM: 4-1 | 30 days supply | Qty: 30 | Fill #0

## 2015-12-08 MED FILL — SUBOXONE 8 MG-2 MG SL FILM: 8-2 | 30 days supply | Qty: 60 | Fill #0

## 2016-01-05 MED FILL — SUBOXONE 8 MG-2 MG SL FILM: 8-2 | 30 days supply | Qty: 90 | Fill #0

## 2016-09-30 ENCOUNTER — Encounter: Payer: Self-pay | Admitting: Podiatry

## 2016-09-30 ENCOUNTER — Ambulatory Visit (INDEPENDENT_AMBULATORY_CARE_PROVIDER_SITE_OTHER): Payer: Medicaid Other | Admitting: Podiatry

## 2016-09-30 VITALS — BP 129/76 | HR 67

## 2016-09-30 DIAGNOSIS — M79676 Pain in unspecified toe(s): Secondary | ICD-10-CM | POA: Diagnosis not present

## 2016-09-30 DIAGNOSIS — L603 Nail dystrophy: Secondary | ICD-10-CM

## 2016-09-30 DIAGNOSIS — B351 Tinea unguium: Secondary | ICD-10-CM

## 2016-09-30 DIAGNOSIS — M79609 Pain in unspecified limb: Secondary | ICD-10-CM

## 2016-09-30 DIAGNOSIS — G8192 Hemiplegia, unspecified affecting left dominant side: Secondary | ICD-10-CM

## 2016-09-30 DIAGNOSIS — Z8673 Personal history of transient ischemic attack (TIA), and cerebral infarction without residual deficits: Secondary | ICD-10-CM

## 2016-09-30 DIAGNOSIS — L608 Other nail disorders: Secondary | ICD-10-CM

## 2016-09-30 NOTE — Progress Notes (Signed)
SUBJECTIVE Patient  presents to office today complaining of elongated, thickened nails. Pain while ambulating in shoes. Patient is unable to trim their own nails.  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 Patient also has a complaint of a painful callus lesion to the right great toe.  OBJECTIVE General Patient is awake, alert, and oriented x 3 and in no acute distress. Derm hyperkeratotic skin lesion noted to the weightbearing surface of the right great toe. Pain on palpation noted. Skin is dry and supple bilateral. Negative open lesions or macerations. Remaining integument unremarkable. Nails are tender, long, thickened and dystrophic with subungual debris, consistent with onychomycosis, 1-5 bilateral. No signs of infection noted. Vasc  DP and PT pedal pulses palpable bilaterally. Temperature gradient within normal limits.  Neuro Epicritic and protective threshold sensation diminished bilaterally.  Musculoskeletal Exam No symptomatic pedal deformities noted bilateral. Muscular strength within normal limits.  ASSESSMENT 1. Onychodystrophic nails 1-5 bilateral with hyperkeratosis of nails.  2. Onychomycosis of nail due to dermatophyte bilateral 3. Pain in foot bilateral 4. Painful callus lesion right great toe  PLAN OF CARE 1. Patient evaluated today.  2. Instructed to maintain good pedal hygiene and foot care.  3. Mechanical debridement of nails 1-5 bilaterally performed using a nail nipper. Filed with dremel without incident.  4. Excisional debridement of hyperkeratotic skin lesion was performed using a chisel blade without incident or bleeding. 5. Return to clinic in 3 mos.    Felecia ShellingBrent M Chasitty Hehl, DPM

## 2016-10-29 MED FILL — SUBOXONE 8 MG-2 MG SL FILM: 8-2 | 28 days supply | Qty: 84 | Fill #0

## 2016-11-06 MED FILL — GABAPENTIN 600 MG TABLET: 600 | 30 days supply | Qty: 90 | Fill #0

## 2016-11-26 MED FILL — SUBOXONE 8 MG-2 MG SL FILM: 8-2 | 28 days supply | Qty: 84 | Fill #0

## 2016-12-06 MED FILL — GABAPENTIN 600 MG TABLET: 600 | 30 days supply | Qty: 90 | Fill #1

## 2016-12-24 MED FILL — SUBOXONE 8 MG-2 MG SL FILM: 8-2 | 28 days supply | Qty: 84 | Fill #0

## 2017-01-16 MED FILL — IBUPROFEN 600 MG TABLET: 600 | 30 days supply | Qty: 90 | Fill #0

## 2017-01-16 MED FILL — CHANTIX 1 MG TABLET: 1 | 30 days supply | Qty: 30 | Fill #0

## 2017-01-21 MED FILL — SUBOXONE 8 MG-2 MG SL FILM: 8-2 | 28 days supply | Qty: 84 | Fill #0

## 2017-01-22 MED FILL — GABAPENTIN 300 MG CAPSULE: 300 | 30 days supply | Qty: 180 | Fill #0

## 2017-02-18 MED FILL — SUBOXONE 8 MG-2 MG SL FILM: 8-2 | 28 days supply | Qty: 84 | Fill #0

## 2017-02-24 MED FILL — GABAPENTIN 300 MG CAPSULE: 300 | 30 days supply | Qty: 180 | Fill #1

## 2017-03-20 MED FILL — SUBOXONE 8 MG-2 MG SL FILM: 8-2 | 28 days supply | Qty: 84 | Fill #0

## 2017-04-10 MED FILL — GABAPENTIN 300 MG CAPSULE: 300 | 30 days supply | Qty: 180 | Fill #2

## 2017-04-17 MED FILL — SUBOXONE 8 MG-2 MG SL FILM: 8-2 | 28 days supply | Qty: 84 | Fill #0

## 2017-05-15 MED FILL — SUBOXONE 8 MG-2 MG SL FILM: 8-2 | 28 days supply | Qty: 84 | Fill #0

## 2017-06-12 MED FILL — SUBOXONE 8 MG-2 MG SL FILM: 8-2 | 28 days supply | Qty: 84 | Fill #0

## 2017-07-08 MED FILL — SUBOXONE 8 MG-2 MG SL FILM: 8-2 | 28 days supply | Qty: 84 | Fill #0

## 2017-07-30 MED FILL — GABAPENTIN 300 MG CAPSULE: 300 | 30 days supply | Qty: 180 | Fill #0

## 2017-08-05 MED FILL — SUBOXONE 8 MG-2 MG SL FILM: 8-2 | 28 days supply | Qty: 84 | Fill #0

## 2017-09-02 MED FILL — SUBOXONE 8 MG-2 MG SL FILM: 8-2 | 30 days supply | Qty: 90 | Fill #0

## 2017-09-02 MED FILL — GABAPENTIN 300 MG CAPSULE: 300 | 30 days supply | Qty: 180 | Fill #1

## 2017-10-03 MED FILL — SUBOXONE 8 MG-2 MG SL FILM: 8-2 | 30 days supply | Qty: 90 | Fill #0

## 2017-10-30 MED FILL — SUBOXONE 8 MG-2 MG SL FILM: 8-2 | 28 days supply | Qty: 84 | Fill #0

## 2017-11-27 MED FILL — GABAPENTIN 300 MG CAPSULE: 300 | 30 days supply | Qty: 180 | Fill #0

## 2017-11-27 MED FILL — SUBOXONE 8 MG-2 MG SL FILM: 8-2 | 28 days supply | Qty: 84 | Fill #0

## 2017-12-25 MED FILL — SUBOXONE 8 MG-2 MG SL FILM: 8-2 | 28 days supply | Qty: 84 | Fill #0

## 2018-01-06 MED FILL — GABAPENTIN 300 MG CAPSULE: 300 | 30 days supply | Qty: 180 | Fill #1

## 2018-01-22 MED FILL — SUBOXONE 8 MG-2 MG SL FILM: 8-2 | 28 days supply | Qty: 84 | Fill #0

## 2018-02-19 MED FILL — SUBOXONE 8 MG-2 MG SL FILM: 8-2 | 28 days supply | Qty: 84 | Fill #0

## 2018-02-19 MED FILL — GABAPENTIN 300 MG CAPSULE: 300 | 30 days supply | Qty: 180 | Fill #2

## 2018-03-19 MED FILL — SUBOXONE 8 MG-2 MG SL FILM: 8-2 | 28 days supply | Qty: 84 | Fill #0

## 2018-04-16 MED FILL — SUBOXONE 8 MG-2 MG SL FILM: 8-2 | 28 days supply | Qty: 84 | Fill #0

## 2018-05-14 MED FILL — SUBOXONE 8 MG-2 MG SL FILM: 8-2 | 28 days supply | Qty: 84 | Fill #0

## 2018-06-11 MED FILL — SUBOXONE 8 MG-2 MG SL FILM: 8-2 | 28 days supply | Qty: 84 | Fill #0

## 2018-07-09 MED FILL — SUBOXONE 8 MG-2 MG SL FILM: 8-2 | 28 days supply | Qty: 84 | Fill #0

## 2018-08-06 MED FILL — SUBOXONE 8 MG-2 MG SL FILM: 8-2 | 28 days supply | Qty: 84 | Fill #0

## 2018-09-03 MED FILL — SUBOXONE 8 MG-2 MG SL FILM: 8-2 | 28 days supply | Qty: 84 | Fill #0

## 2018-10-01 MED FILL — SUBOXONE 8 MG-2 MG SL FILM: 8-2 | 28 days supply | Qty: 84 | Fill #0

## 2018-10-29 MED FILL — SUBOXONE 8 MG-2 MG SL FILM: 8-2 | 28 days supply | Qty: 84 | Fill #0

## 2018-11-26 MED FILL — SUBOXONE 8 MG-2 MG SL FILM: 8-2 | 28 days supply | Qty: 84 | Fill #0

## 2018-12-24 MED FILL — SUBOXONE 8 MG-2 MG SL FILM: 8-2 | 28 days supply | Qty: 84 | Fill #0

## 2019-01-21 MED FILL — SUBOXONE 8 MG-2 MG SL FILM: 8-2 | 28 days supply | Qty: 84 | Fill #0

## 2019-02-18 MED FILL — SUBOXONE 8 MG-2 MG SL FILM: 8-2 | 28 days supply | Qty: 84 | Fill #0

## 2019-03-18 MED FILL — SUBOXONE 8 MG-2 MG SL FILM: 8-2 | 28 days supply | Qty: 84 | Fill #0

## 2019-04-15 MED FILL — SUBOXONE 8 MG-2 MG SL FILM: 8-2 | 28 days supply | Qty: 84 | Fill #0

## 2019-05-13 MED FILL — SUBOXONE 8 MG-2 MG SL FILM: 8-2 | 28 days supply | Qty: 84 | Fill #0

## 2019-06-10 MED FILL — SUBOXONE 8 MG-2 MG SL FILM: 8-2 | 28 days supply | Qty: 84 | Fill #0

## 2019-07-08 MED FILL — SUBOXONE 8 MG-2 MG SL FILM: 8-2 | 28 days supply | Qty: 84 | Fill #0

## 2019-08-05 MED FILL — SUBOXONE 8 MG-2 MG SL FILM: 8-2 | 28 days supply | Qty: 84 | Fill #0

## 2019-09-02 MED FILL — SUBOXONE 8 MG-2 MG SL FILM: 8-2 | 28 days supply | Qty: 84 | Fill #0

## 2019-09-30 MED FILL — SUBOXONE 8 MG-2 MG SL FILM: 8-2 | 28 days supply | Qty: 84 | Fill #0

## 2019-10-26 MED FILL — SUBOXONE 8 MG-2 MG SL FILM: 8-2 | 28 days supply | Qty: 84 | Fill #0

## 2019-11-23 MED FILL — SUBOXONE 8 MG-2 MG SL FILM: 8-2 | 28 days supply | Qty: 84 | Fill #0

## 2019-12-21 MED FILL — SUBOXONE 8 MG-2 MG SL FILM: 8-2 | 28 days supply | Qty: 84 | Fill #0

## 2020-01-18 MED FILL — SUBOXONE 8 MG-2 MG SL FILM: 8-2 | 28 days supply | Qty: 84 | Fill #0

## 2020-02-15 MED FILL — SUBOXONE 8 MG-2 MG SL FILM: 8-2 | 28 days supply | Qty: 84 | Fill #0

## 2020-03-14 MED FILL — SUBOXONE 8 MG-2 MG SL FILM: 8-2 | 28 days supply | Qty: 84 | Fill #0

## 2020-04-11 MED FILL — SUBOXONE 8 MG-2 MG SL FILM: 8-2 | 28 days supply | Qty: 84 | Fill #0

## 2020-05-09 MED FILL — SUBOXONE 8 MG-2 MG SL FILM: 8-2 | 28 days supply | Qty: 84 | Fill #0

## 2020-06-05 MED FILL — SUBOXONE 8 MG-2 MG SL FILM: 8-2 | 30 days supply | Qty: 90 | Fill #0

## 2020-07-05 MED FILL — SUBOXONE 8 MG-2 MG SL FILM: 8-2 | 28 days supply | Qty: 84 | Fill #0

## 2020-07-14 ENCOUNTER — Other Ambulatory Visit (HOSPITAL_COMMUNITY): Payer: Self-pay | Admitting: Family Medicine

## 2020-07-14 MED FILL — VITAMIN D3 50,000 UNITS CAP: 1.25 MG | 91 days supply | Qty: 13 | Fill #0

## 2020-07-28 MED FILL — VITAMIN D3 50,000 UNITS CAP: 1.25 MG | 91 days supply | Qty: 13 | Fill #0

## 2020-08-02 ENCOUNTER — Other Ambulatory Visit (HOSPITAL_COMMUNITY): Payer: Self-pay | Admitting: Physician Assistant

## 2020-08-02 MED FILL — SUBOXONE 8 MG-2 MG SL FILM: 8-2 | 28 days supply | Qty: 84 | Fill #0

## 2020-08-25 MED FILL — GABAPENTIN 300 MG CAPSULE: 300 | 30 days supply | Qty: 30 | Fill #0

## 2020-08-30 ENCOUNTER — Other Ambulatory Visit (HOSPITAL_COMMUNITY): Payer: Self-pay | Admitting: Physician Assistant

## 2020-08-30 MED FILL — SUBOXONE 8 MG-2 MG SL FILM: 8-2 | 28 days supply | Qty: 84 | Fill #0

## 2020-09-22 MED FILL — GABAPENTIN 300 MG CAPSULE: 300 | 30 days supply | Qty: 30 | Fill #1

## 2020-09-27 ENCOUNTER — Other Ambulatory Visit (HOSPITAL_COMMUNITY): Payer: Self-pay | Admitting: Physician Assistant

## 2020-09-28 MED FILL — SUBOXONE 8 MG-2 MG SL FILM: 8-2 | 21 days supply | Qty: 63 | Fill #0

## 2020-10-10 ENCOUNTER — Telehealth: Payer: Self-pay | Admitting: Oncology

## 2020-10-10 ENCOUNTER — Other Ambulatory Visit (HOSPITAL_COMMUNITY): Payer: Self-pay | Admitting: Family Medicine

## 2020-10-10 MED FILL — SYMBICORT 160-4.5 MCG INH: 160-4.5 | 90 days supply | Qty: 31 | Fill #0

## 2020-10-10 NOTE — Telephone Encounter (Signed)
New Patient referred by Terrilyn Saver, AGNP for Low WBC/Platelets for 11/06/2020 Labs 2:45 pm - Consult 3:15 pm.  Patient notified

## 2020-10-16 ENCOUNTER — Telehealth: Payer: Self-pay | Admitting: Oncology

## 2020-10-16 NOTE — Telephone Encounter (Signed)
Patient called to verify Jan 2022 Appt's 

## 2020-10-18 ENCOUNTER — Other Ambulatory Visit (HOSPITAL_COMMUNITY): Payer: Self-pay | Admitting: Physician Assistant

## 2020-10-18 MED FILL — SUBOXONE 8 MG-2 MG SL FILM: 8-2 | 28 days supply | Qty: 84 | Fill #0

## 2020-11-05 ENCOUNTER — Other Ambulatory Visit: Payer: Self-pay | Admitting: Oncology

## 2020-11-05 DIAGNOSIS — D696 Thrombocytopenia, unspecified: Secondary | ICD-10-CM | POA: Insufficient documentation

## 2020-11-05 DIAGNOSIS — D72819 Decreased white blood cell count, unspecified: Secondary | ICD-10-CM

## 2020-11-05 DIAGNOSIS — D61818 Other pancytopenia: Secondary | ICD-10-CM | POA: Insufficient documentation

## 2020-11-05 NOTE — Progress Notes (Deleted)
[facility]  [address] [place],  [place]  [address] [phone]  Clinic Day:  11/05/2020  Referring physician: Eber Jones, NP   HISTORY OF PRESENT ILLNESS:  The patient is a 65 y.o. female  who I was asked to consult upon for leukopenia and thrombocytopenia.  Labs in October 2021 showed a low white count of 2.8 and a low platelet count of 86.    PAST MEDICAL HISTORY:   Past Medical History:  Diagnosis Date  . Stroke [facility])     PAST SURGICAL HISTORY:   Past Surgical History:  Procedure Laterality Date  . ECTOPIC PREGNANCY SURGERY      CURRENT MEDICATIONS:   Current Outpatient Medications  Medication Sig Dispense Refill  . buprenorphine-naloxone (SUBOXONE) 8-2 MG SUBL Place 1 tablet under the tongue 2 (two) times daily.      . CHANTIX 1 MG tablet Take 1 mg by mouth daily.  1  . gabapentin (NEURONTIN) 300 MG capsule Take 300 mg by mouth 3 (three) times daily.      Marland Kitchen ibuprofen (ADVIL,MOTRIN) 600 MG tablet Take 600 mg by mouth 3 (three) times daily.  2  . promethazine (PHENERGAN) 25 MG tablet Take 1 tablet (25 mg total) by mouth every 6 (six) hours as needed for nausea. 30 tablet 5   No current facility-administered medications for this visit.    ALLERGIES:  No Known Allergies  FAMILY HISTORY:  No family history on file.  SOCIAL HISTORY:   reports that she has been smoking. She has been smoking about 0.50 packs per day. She does not have any smokeless tobacco history on file. She reports current alcohol use. She reports that she does not use drugs.  REVIEW OF SYSTEMS:  Review of Systems - Oncology   PHYSICAL EXAM:  There were no vitals taken for this visit. Wt Readings from Last 3 Encounters:  No data found for Wt   There is no height or weight on file to calculate BMI. Performance status (ECOG): {CHL ONC [id number] Physical Exam .phy  LABS:   CBC Latest Ref Rng & Units 07/16/2012 06/25/2012 06/04/2012  WBC 4.0 -  10.5 K/uL 1.6(L) 2.0(L) 2.5(L)  Hemoglobin 12.0 - 15.0 g/dL 10.1(L) 9.9(L) 10.1(L)  Hematocrit 36.0 - 46.0 % 30.4(L) 29.3(L) 30.5(L)  Platelets 150 - 400 K/uL 61(L) 74(L) 99(L)   CMP Latest Ref Rng & Units 07/16/2012 06/25/2012 06/04/2012  Glucose 70 - 99 mg/dL - - -  BUN 6 - 23 mg/dL - - -  Creatinine 0.62 - 1.10 mg/dL - - -  Sodium 694 - 854 mEq/L - - -  Potassium 3.5 - 5.3 mEq/L - - -  Chloride 96 - 112 mEq/L - - -  CO2 19 - 32 mEq/L - - -  Calcium 8.4 - 10.5 mg/dL - - -  Total Protein 6.0 - 8.3 g/dL 6.6 6.9 6.9  Total Bilirubin 0.3 - 1.2 mg/dL 0.5 0.5 0.5  Alkaline Phos 39 - 117 U/L 75 83 71  AST 0 - 37 U/L 103(H) 84(H) 85(H)  ALT 0 - 35 U/L 65(H) 55(H) 60(H)     No results found for: CEA1 / No results found for: CEA1 No results found for: PSA1 No results found for: OEV035 No results found for: KKX381  No results found for: TOTALPROTELP, ALBUMINELP, A1GS, A2GS, BETS, BETA2SER, GAMS, MSPIKE, SPEI Lab Results  Component Value Date   TIBC 294 01/01/2012   FERRITIN 192 12/26/2011   IRONPCTSAT 43  01/01/2012   No results found for: LDH  No results found for: AFPTUMOR, TOTALPROTELP, ALBUMINELP, A1GS, A2GS, BETS, BETA2SER, GAMS, MSPIKE, SPEI, LDH, CEA1, PSA1, IGASERUM, IGGSERUM, IGMSERUM, THGAB, THYROGLB  Recent Review Flowsheet Data    Oncology Labs Latest Ref Rng & Units 12/26/2011 01/01/2012   FERRITIN 10 - 291 ng/mL 192 -   IRONPCTSAT 20 - 55 % - 43      STUDIES:  No results found.   ASSESSMENT & PLAN:  A 65 y.o. female who I was asked to consult upon for *** .The patient understands all the plans discussed today and is in agreement with them.  I do appreciate Eber Jones, NP for his new consult.   Aleksandar Duve Kirby Funk, MD

## 2020-11-06 ENCOUNTER — Inpatient Hospital Stay: Payer: Medicaid Other | Attending: Oncology

## 2020-11-06 ENCOUNTER — Inpatient Hospital Stay: Payer: Medicaid Other | Admitting: Oncology

## 2020-11-07 ENCOUNTER — Telehealth: Payer: Self-pay | Admitting: Oncology

## 2020-11-07 NOTE — Telephone Encounter (Signed)
Left message with Brother to call back to rescheduled appt.   Mother just passed away

## 2020-11-10 ENCOUNTER — Other Ambulatory Visit (HOSPITAL_COMMUNITY): Payer: Self-pay | Admitting: Family Medicine

## 2020-11-11 MED FILL — IBUPROFEN 800 MG TAB: 800 | 5 days supply | Qty: 15 | Fill #0

## 2020-11-15 ENCOUNTER — Other Ambulatory Visit (HOSPITAL_COMMUNITY): Payer: Self-pay | Admitting: Physician Assistant

## 2020-11-15 MED FILL — SUBOXONE 8 MG-2 MG SL FILM: 8-2 | 28 days supply | Qty: 84 | Fill #0

## 2020-11-22 ENCOUNTER — Other Ambulatory Visit: Payer: Self-pay | Admitting: Oncology

## 2020-11-22 DIAGNOSIS — D72819 Decreased white blood cell count, unspecified: Secondary | ICD-10-CM

## 2020-11-22 NOTE — Progress Notes (Signed)
[facility]  [address] [place],  [place]  [address] [phone]  Clinic Day:  11/23/2020  Referring physician: Eber Jones, NP   HISTORY OF PRESENT ILLNESS:  The patient is a 65 y.o. female  who I was asked to consult upon for leukopenia and thrombocytopenia.  Labs in October 2021 revealed a low white count of 2.8, with a low platelet count of 86.  According to the patient, these labs were done as part of a routine follow-up visit.  She denies having any spontaneous infections, as it pertains to his leukopenia.  She denies having any subcutaneous bleeding/bruising issues as it pertains to her thrombocytopenia.  She denies being placed on any new medications which could have precipitated her low counts.  She denies having any B symptoms which concern her for an underlying hematologic malignancy being present.  Of note, this patient has a history of hepatitis C.  She took interferon shots 30 years ago for this, but does not know the current status of her hepatits C.  Although she denies being told of having low platelets before, labs in 2018 also showed a low platelet count of 86.   PAST MEDICAL HISTORY:   Past Medical History:  Diagnosis Date  . Arthritis   . COPD (chronic obstructive pulmonary disease) (HCC)   . Stroke (HCC)   Hepatitis C Vitamin D deficiency  PAST SURGICAL HISTORY:   Past Surgical History:  Procedure Laterality Date  . ECTOPIC PREGNANCY SURGERY      CURRENT MEDICATIONS:   Current Outpatient Medications  Medication Sig Dispense Refill  . buprenorphine-naloxone (SUBOXONE) 8-2 MG SUBL Place 1 tablet under the tongue 2 (two) times daily.      . CHANTIX 1 MG tablet Take 1 mg by mouth daily.  1  . gabapentin (NEURONTIN) 300 MG capsule Take 300 mg by mouth 3 (three) times daily.      Marland Kitchen ibuprofen (ADVIL,MOTRIN) 600 MG tablet Take 600 mg by mouth 3 (three) times daily.  2  . promethazine (PHENERGAN) 25 MG tablet Take 1 tablet  (25 mg total) by mouth every 6 (six) hours as needed for nausea. 30 tablet 5   No current facility-administered medications for this visit.    ALLERGIES:  No Known Allergies  FAMILY HISTORY:  Her father has a history of prostate cancer.  Her sister died from lung cancer.  A maternal aunt had melanoma.  A maternal uncle had prostate cancer.    SOCIAL HISTORY:  The patient was born in [place].  She lives there with her father.  She is widowed with no children.  She previously worked for an Scient[employer], [employer].  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  She smoked a pack of cigarettes daily for 41 years.  She denies alcohol use.  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    REVIEW OF SYSTEMS:  Review of Systems  Constitutional: Positive for fatigue. Negative for fever.  HENT:   Negative for hearing loss and sore throat.   Eyes: Negative for eye problems.  Respiratory: Positive for cough. Negative for chest tightness and hemoptysis.   Cardiovascular: Negative for chest pain and palpitations.  Gastrointestinal: Negative for abdominal distention, abdominal pain, blood in stool, constipation, diarrhea, nausea and vomiting.  Endocrine: Negative for hot flashes.  Genitourinary: Positive for dysuria. Negative for difficulty urinating, frequency, hematuria and nocturia.   Musculoskeletal: Negative for arthralgias, back pain, gait problem and myalgias.  Skin: Negative.  Negative for itching and  rash.  Neurological: Negative.  Negative for dizziness, extremity weakness, gait problem, headaches, light-headedness and numbness.  Hematological: Negative.   Psychiatric/Behavioral: Negative.  Negative for depression and suicidal ideas. The patient is not nervous/anxious.      PHYSICAL EXAM:  Blood pressure (!) 145/83, pulse 70, temperature 98.1 F (36.7 C), resp. rate 16, height 5\' 3"  (1.6 m), weight 118 lb 6.4 oz (53.7 kg), SpO2 96 %. Wt Readings from Last 3 Encounters:  11/23/20 118 lb 6.4 oz (53.7 kg)   Body mass index  is 20.97 kg/m. Performance status (ECOG): 1 - Symptomatic but completely ambulatory Physical Exam Constitutional:      Appearance: Normal appearance. She is not ill-appearing.  HENT:     Mouth/Throat:     Mouth: Mucous membranes are moist.     Pharynx: Oropharynx is clear. No oropharyngeal exudate or posterior oropharyngeal erythema.  Cardiovascular:     Rate and Rhythm: Normal rate and regular rhythm.     Heart sounds: No murmur heard. No friction rub. No gallop.   Pulmonary:     Effort: Pulmonary effort is normal. No respiratory distress.     Breath sounds: Normal breath sounds. No wheezing, rhonchi or rales.  Chest:  Breasts:     Right: No axillary adenopathy or supraclavicular adenopathy.     Left: No axillary adenopathy or supraclavicular adenopathy.    Abdominal:     General: Bowel sounds are normal. There is no distension.     Palpations: Abdomen is soft. There is no mass.     Tenderness: There is no abdominal tenderness.  Musculoskeletal:        General: No swelling.     Right lower leg: No edema.     Left lower leg: No edema.  Lymphadenopathy:     Cervical: No cervical adenopathy.     Upper Body:     Right upper body: No supraclavicular or axillary adenopathy.     Left upper body: No supraclavicular or axillary adenopathy.     Lower Body: No right inguinal adenopathy. No left inguinal adenopathy.  Skin:    General: Skin is warm.     Coloration: Skin is not jaundiced.     Findings: No lesion or rash.  Neurological:     General: No focal deficit present.     Mental Status: She is alert and oriented to person, place, and time. Mental status is at baseline.     Cranial Nerves: Cranial nerves are intact.  Psychiatric:        Mood and Affect: Mood normal.        Behavior: Behavior normal.        Thought Content: Thought content normal.    LABS:   CBC Latest Ref Rng & Units 11/23/2020 07/16/2012 06/25/2012  WBC - 2.6 1.6(L) 2.0(L)  Hemoglobin 12.0 - 16.0 12.7  10.1(L) 9.9(L)  Hematocrit 36 - 46 37 30.4(L) 29.3(L)  Platelets 150 - 399 81(A) 61(L) 74(L)   CMP Latest Ref Rng & Units 11/23/2020 07/16/2012 06/25/2012  Glucose 70 - 99 mg/dL - - -  BUN 4 - 21 18 - -  Creatinine 0.5 - 1.1 0.7 - -  Sodium 137 - 147 139 - -  Potassium 3.4 - 5.3 4.2 - -  Chloride 99 - 108 106 - -  CO2 13 - 22 27(A) - -  Calcium 8.7 - 10.7 9.7 - -  Total Protein 6.0 - 8.3 g/dL - 6.6 6.9  Total Bilirubin 0.3 - 1.2 mg/dL -  0.5 0.5  Alkaline Phos 25 - 125 47 75 83  AST 13 - 35 25 103(H) 84(H)  ALT 7 - 35 17 65(H) 55(H)    Lab Results  Component Value Date   TIBC 283 11/23/2020   TIBC 294 01/01/2012   FERRITIN 45 11/23/2020   FERRITIN 192 12/26/2011   IRONPCTSAT 33.2 11/23/2020   IRONPCTSAT 43 01/01/2012    Recent Review Flowsheet Data    Oncology Labs 12/26/2011 01/01/2012 11/23/2020   FERRITIN 192 - 45   IRONPCTSAT - 43 33.2      ASSESSMENT & PLAN:  A 65 y.o. female who I was asked to consult upon for leukopenia and thrombocytopenia.  In clinic today, I will check her B12 and folate levels to ensure there are no nutritional deficiencies factoring into her cytopenias.  Her TSH will also be checked to ensure severe thyroid disease is not factoring into her low counts.  Although her liver enzymes were normal today, the fact that they have been elevated in the past, in conjunction with her history of hepatitis C, has me concerned her low white cells and platelets may be related to underlying liver disease.  I will check her hepatitis C viral load today.  If elevated, I would also have her undergo scans to see if she has underlying cirrhosis.  It is not uncommon for liver disease to cause concomitant leukopenia and thrombocytopenia.  I will see her back in 1 month to review her labs collected today, as well as to reassess her peripheral counts at that time. The patient understands all the plans discussed today and is in agreement with them.  I do appreciate Eber Jones,  NP for his new consult.    Kirby Funk, MD

## 2020-11-23 ENCOUNTER — Inpatient Hospital Stay (INDEPENDENT_AMBULATORY_CARE_PROVIDER_SITE_OTHER): Payer: Medicaid Other | Admitting: Oncology

## 2020-11-23 ENCOUNTER — Other Ambulatory Visit: Payer: Self-pay | Admitting: Oncology

## 2020-11-23 ENCOUNTER — Other Ambulatory Visit: Payer: Self-pay

## 2020-11-23 ENCOUNTER — Inpatient Hospital Stay: Payer: Medicaid Other

## 2020-11-23 ENCOUNTER — Encounter: Payer: Self-pay | Admitting: Oncology

## 2020-11-23 ENCOUNTER — Telehealth: Payer: Self-pay | Admitting: Oncology

## 2020-11-23 ENCOUNTER — Other Ambulatory Visit: Payer: Self-pay | Admitting: Hematology and Oncology

## 2020-11-23 VITALS — BP 145/83 | HR 70 | Temp 98.1°F | Resp 16 | Ht 63.0 in | Wt 118.4 lb

## 2020-11-23 DIAGNOSIS — D72819 Decreased white blood cell count, unspecified: Secondary | ICD-10-CM | POA: Diagnosis not present

## 2020-11-23 DIAGNOSIS — D696 Thrombocytopenia, unspecified: Secondary | ICD-10-CM

## 2020-11-23 DIAGNOSIS — B182 Chronic viral hepatitis C: Secondary | ICD-10-CM

## 2020-11-23 LAB — BASIC METABOLIC PANEL
BUN: 18 (ref 4–21)
CO2: 27 — AB (ref 13–22)
Chloride: 106 (ref 99–108)
Creatinine: 0.7 (ref 0.5–1.1)
Glucose: 119
Potassium: 4.2 (ref 3.4–5.3)
Sodium: 139 (ref 137–147)

## 2020-11-23 LAB — HEPATIC FUNCTION PANEL
ALT: 17 (ref 7–35)
AST: 25 (ref 13–35)
Alkaline Phosphatase: 47 (ref 25–125)
Bilirubin, Total: 0.6

## 2020-11-23 LAB — CBC AND DIFFERENTIAL
HCT: 37 (ref 36–46)
Hemoglobin: 12.7 (ref 12.0–16.0)
Neutrophils Absolute: 1.61
Platelets: 81 — AB (ref 150–399)
WBC: 2.6

## 2020-11-23 LAB — IRON,TIBC AND FERRITIN PANEL
%SAT: 33.2
Ferritin: 45
Iron: 94
TIBC: 283

## 2020-11-23 LAB — COMPREHENSIVE METABOLIC PANEL
Albumin: 4.5 (ref 3.5–5.0)
Calcium: 9.7 (ref 8.7–10.7)

## 2020-11-23 LAB — CBC
Absolute Lymphocytes: 0.6 — AB (ref 0.65–4.75)
MCV: 87 (ref 81–99)
RBC: 4.24 (ref 3.87–5.11)

## 2020-11-23 LAB — FOLATE: Folate: >20

## 2020-11-23 LAB — VITAMIN B12: Vitamin B-12: 329

## 2020-11-23 NOTE — Telephone Encounter (Signed)
Per 1/27 LOS, patient scheduled for Feb Appt's.  Gave patient Appt Summary

## 2020-12-13 ENCOUNTER — Other Ambulatory Visit (HOSPITAL_COMMUNITY): Payer: Self-pay | Admitting: Family Medicine

## 2020-12-13 MED FILL — GABAPENTIN 300 MG CAPSULE: 300 | 30 days supply | Qty: 30 | Fill #0

## 2020-12-13 MED FILL — SUBOXONE 8 MG-2 MG SL FILM: 8-2 | 28 days supply | Qty: 84 | Fill #0

## 2020-12-24 NOTE — Progress Notes (Signed)
[facility]  [address] [place],  [place]  [address] [phone]  Clinic Day:  11/23/2020  Referring physician: [facility]   HISTORY OF PRESENT ILLNESS:  The patient is a 65 y.o. female  who I recently began seeing for leukopenia and thrombocytopenia.  She comes in today to reassess her counts.  Since her last visit, the patient has been doing well.  As it relates to her leukopenia and thrombocytopenia, she denies having any spontaneous infections or bleeding/bruising issues, respectively, which concern her for worsening peripheral counts.  Of note, this patient has a history of hepatitis C.  She took interferon shots 30 years ago for this, but does not know the current status of her hepatits C.   Hepatitis C testing was done at her last visit to ascertain her status.    PHYSICAL EXAM:  Blood pressure (!) 142/93, pulse 77, temperature 98.6 F (37 C), resp. rate 16, height 5\' 3"  (1.6 m), weight 117 lb (53.1 kg), SpO2 96 %. Wt Readings from Last 3 Encounters:  12/25/20 117 lb (53.1 kg)  11/23/20 118 lb 6.4 oz (53.7 kg)   Body mass index is 20.73 kg/m. Performance status (ECOG): 1 - Symptomatic but completely ambulatory Physical Exam Constitutional:      Appearance: Normal appearance. She is not ill-appearing.  HENT:     Mouth/Throat:     Mouth: Mucous membranes are moist.     Pharynx: Oropharynx is clear. No oropharyngeal exudate or posterior oropharyngeal erythema.  Cardiovascular:     Rate and Rhythm: Normal rate and regular rhythm.     Heart sounds: No murmur heard. No friction rub. No gallop.   Pulmonary:     Effort: Pulmonary effort is normal. No respiratory distress.     Breath sounds: Normal breath sounds. No wheezing, rhonchi or rales.  Chest:  Breasts:     Right: No axillary adenopathy or supraclavicular adenopathy.     Left: No axillary adenopathy or supraclavicular adenopathy.    Abdominal:     General: Bowel  sounds are normal. There is no distension.     Palpations: Abdomen is soft. There is no mass.     Tenderness: There is no abdominal tenderness.  Musculoskeletal:        General: No swelling.     Right lower leg: No edema.     Left lower leg: No edema.  Lymphadenopathy:     Cervical: No cervical adenopathy.     Upper Body:     Right upper body: No supraclavicular or axillary adenopathy.     Left upper body: No supraclavicular or axillary adenopathy.     Lower Body: No right inguinal adenopathy. No left inguinal adenopathy.  Skin:    General: Skin is warm.     Coloration: Skin is not jaundiced.     Findings: No lesion or rash.  Neurological:     General: No focal deficit present.     Mental Status: She is alert and oriented to person, place, and time. Mental status is at baseline.     Cranial Nerves: Cranial nerves are intact.  Psychiatric:        Mood and Affect: Mood normal.        Behavior: Behavior normal.        Thought Content: Thought content normal.    LABS:    CBC Latest Ref Rng & Units 12/25/2020 11/23/2020 07/16/2012  WBC - 4.2 2.6 1.6(L)  Hemoglobin 12.0 - 16.0  13.4 12.7 10.1(L)  Hematocrit 36 - 46 39 37 30.4(L)  Platelets 150 - 399 98(A) 81(A) 61(L)   CMP Latest Ref Rng & Units 11/23/2020 07/16/2012 06/25/2012  Glucose 70 - 99 mg/dL - - -  BUN 4 - 21 18 - -  Creatinine 0.5 - 1.1 0.7 - -  Sodium 137 - 147 139 - -  Potassium 3.4 - 5.3 4.2 - -  Chloride 99 - 108 106 - -  CO2 13 - 22 27(A) - -  Calcium 8.7 - 10.7 9.7 - -  Total Protein 6.0 - 8.3 g/dL - 6.6 6.9  Total Bilirubin 0.3 - 1.2 mg/dL - 0.5 0.5  Alkaline Phos 25 - 125 47 75 83  AST 13 - 35 25 103(H) 84(H)  ALT 7 - 35 17 65(H) 55(H)    ASSESSMENT & PLAN:  A 65 y.o. female with both leukopenia and thrombocytopenia.  Her labs today show that her white count is back to normal.  Her platelets are only minimally low.  Overall, none of her counts is dangerously low to where her quality of life should be  impacted.  Her hepatitis C testing is encouraging, as it shows her viral load is essentially undetectable.  I still believe her chronically low counts are due to underlying liver damage from her hepatitis C.  However, as they are decent now, her counts will be followed conservatively. I will see her back in 1 year for repeat clinical assessment.  The patient understands all the plans discussed today and is in agreement with them.   Braelyn Bordonaro Kirby Funk, MD

## 2020-12-25 ENCOUNTER — Inpatient Hospital Stay: Payer: Medicaid Other | Attending: Oncology

## 2020-12-25 ENCOUNTER — Telehealth: Payer: Self-pay | Admitting: Oncology

## 2020-12-25 ENCOUNTER — Other Ambulatory Visit: Payer: Self-pay

## 2020-12-25 ENCOUNTER — Inpatient Hospital Stay (INDEPENDENT_AMBULATORY_CARE_PROVIDER_SITE_OTHER): Payer: Medicaid Other | Admitting: Oncology

## 2020-12-25 ENCOUNTER — Other Ambulatory Visit: Payer: Self-pay | Admitting: Hematology and Oncology

## 2020-12-25 ENCOUNTER — Other Ambulatory Visit: Payer: Self-pay | Admitting: Oncology

## 2020-12-25 VITALS — BP 142/93 | HR 77 | Temp 98.6°F | Resp 16 | Ht 63.0 in | Wt 117.0 lb

## 2020-12-25 DIAGNOSIS — D72819 Decreased white blood cell count, unspecified: Secondary | ICD-10-CM | POA: Insufficient documentation

## 2020-12-25 DIAGNOSIS — B182 Chronic viral hepatitis C: Secondary | ICD-10-CM

## 2020-12-25 DIAGNOSIS — B1920 Unspecified viral hepatitis C without hepatic coma: Secondary | ICD-10-CM | POA: Diagnosis not present

## 2020-12-25 DIAGNOSIS — D696 Thrombocytopenia, unspecified: Secondary | ICD-10-CM

## 2020-12-25 LAB — HEPATITIS C ANTIBODY: HCV Ab: REACTIVE — AB

## 2020-12-25 LAB — CBC AND DIFFERENTIAL
HCT: 39 (ref 36–46)
Hemoglobin: 13.4 (ref 12.0–16.0)
Neutrophils Absolute: 2.69
Platelets: 98 — AB (ref 150–399)
WBC: 4.2

## 2020-12-25 LAB — CBC: RBC: 4.6 (ref 3.87–5.11)

## 2020-12-25 NOTE — Telephone Encounter (Signed)
Per 2/28 LOS, patient scheduled for Feb 2023 Appt's.  Gave patient Appt Summary

## 2020-12-26 LAB — HCV RNA QUANT RFLX ULTRA OR GENOTYP
HCV RNA Qnt(log copy/mL): UNDETERMINED log10 IU/mL
HepC Qn: NOT DETECTED IU/mL

## 2021-01-10 ENCOUNTER — Other Ambulatory Visit (HOSPITAL_COMMUNITY): Payer: Self-pay | Admitting: Physician Assistant

## 2021-02-07 ENCOUNTER — Other Ambulatory Visit (HOSPITAL_COMMUNITY): Payer: Self-pay

## 2021-02-07 MED ORDER — BUPRENORPHINE HCL-NALOXONE HCL 8-2 MG SL FILM
ORAL_FILM | SUBLINGUAL | 0 refills | Status: DC
  Filled 2021-02-07: qty 84, 28d supply, fill #0

## 2021-02-07 MED FILL — Gabapentin Cap 300 MG: ORAL | 30 days supply | Qty: 30 | Fill #0 | Status: AC

## 2021-03-07 ENCOUNTER — Other Ambulatory Visit (HOSPITAL_COMMUNITY): Payer: Self-pay

## 2021-03-07 MED ORDER — BUPRENORPHINE HCL-NALOXONE HCL 8-2 MG SL FILM
ORAL_FILM | SUBLINGUAL | 0 refills | Status: DC
Start: 2021-03-07 — End: 2021-04-04
  Filled 2021-03-07: qty 84, 28d supply, fill #0

## 2021-03-30 ENCOUNTER — Other Ambulatory Visit (HOSPITAL_COMMUNITY): Payer: Self-pay

## 2021-03-30 MED ORDER — IBUPROFEN 800 MG PO TABS
ORAL_TABLET | ORAL | 0 refills | Status: AC
  Filled 2021-03-30: qty 15, 5d supply, fill #0

## 2021-04-02 ENCOUNTER — Other Ambulatory Visit (HOSPITAL_COMMUNITY): Payer: Self-pay

## 2021-04-04 ENCOUNTER — Other Ambulatory Visit (HOSPITAL_COMMUNITY): Payer: Self-pay

## 2021-04-04 MED ORDER — BUPRENORPHINE HCL-NALOXONE HCL 8-2 MG SL FILM
ORAL_FILM | SUBLINGUAL | 0 refills | Status: DC
  Filled 2021-04-04: qty 84, 28d supply, fill #0

## 2021-04-05 ENCOUNTER — Other Ambulatory Visit (HOSPITAL_COMMUNITY): Payer: Self-pay

## 2021-05-02 ENCOUNTER — Other Ambulatory Visit (HOSPITAL_COMMUNITY): Payer: Self-pay

## 2021-05-02 MED ORDER — BUPRENORPHINE HCL-NALOXONE HCL 8-2 MG SL FILM
ORAL_FILM | SUBLINGUAL | 0 refills | Status: DC
  Filled 2021-05-02: qty 84, 28d supply, fill #0

## 2021-05-03 ENCOUNTER — Other Ambulatory Visit (HOSPITAL_COMMUNITY): Payer: Self-pay

## 2021-05-04 ENCOUNTER — Other Ambulatory Visit (HOSPITAL_COMMUNITY): Payer: Self-pay

## 2021-05-04 MED ORDER — GABAPENTIN 300 MG PO CAPS
300.0000 mg | ORAL_CAPSULE | Freq: Every day | ORAL | 1 refills | Status: DC
  Filled 2021-05-04 – 2021-05-30 (×2): qty 30, 30d supply, fill #0
  Filled 2021-10-23: qty 30, 30d supply, fill #1

## 2021-05-07 ENCOUNTER — Ambulatory Visit: Payer: Self-pay | Admitting: Podiatry

## 2021-05-07 ENCOUNTER — Other Ambulatory Visit (HOSPITAL_COMMUNITY): Payer: Self-pay

## 2021-05-12 ENCOUNTER — Other Ambulatory Visit (HOSPITAL_COMMUNITY): Payer: Self-pay

## 2021-05-30 ENCOUNTER — Other Ambulatory Visit (HOSPITAL_COMMUNITY): Payer: Self-pay

## 2021-05-30 MED ORDER — SUBOXONE 8-2 MG SL FILM
ORAL_FILM | SUBLINGUAL | 0 refills | Status: DC
  Filled 2021-05-30: qty 84, 28d supply, fill #0

## 2021-06-27 ENCOUNTER — Other Ambulatory Visit (HOSPITAL_COMMUNITY): Payer: Self-pay

## 2021-06-27 MED ORDER — SUBOXONE 8-2 MG SL FILM
ORAL_FILM | SUBLINGUAL | 0 refills | Status: DC
  Filled 2021-06-27: qty 90, 30d supply, fill #0

## 2021-07-25 ENCOUNTER — Other Ambulatory Visit (HOSPITAL_COMMUNITY): Payer: Self-pay

## 2021-07-25 MED ORDER — SUBOXONE 8-2 MG SL FILM
ORAL_FILM | SUBLINGUAL | 0 refills | Status: AC
  Filled 2021-07-25: qty 90, 30d supply, fill #0

## 2021-08-22 ENCOUNTER — Other Ambulatory Visit (HOSPITAL_COMMUNITY): Payer: Self-pay

## 2021-08-22 MED ORDER — BUPRENORPHINE HCL-NALOXONE HCL 12-3 MG SL FILM
ORAL_FILM | SUBLINGUAL | 0 refills | Status: DC
  Filled 2021-08-22: qty 60, 30d supply, fill #0

## 2021-09-17 ENCOUNTER — Other Ambulatory Visit (HOSPITAL_COMMUNITY): Payer: Self-pay

## 2021-09-17 MED ORDER — BUPRENORPHINE HCL-NALOXONE HCL 12-3 MG SL FILM
ORAL_FILM | SUBLINGUAL | 0 refills | Status: DC
  Filled 2021-09-17 – 2021-09-24 (×3): qty 60, 30d supply, fill #0
  Filled ????-??-??: fill #0

## 2021-09-18 ENCOUNTER — Other Ambulatory Visit (HOSPITAL_COMMUNITY): Payer: Self-pay

## 2021-09-19 ENCOUNTER — Other Ambulatory Visit (HOSPITAL_COMMUNITY): Payer: Self-pay

## 2021-09-21 ENCOUNTER — Other Ambulatory Visit (HOSPITAL_COMMUNITY): Payer: Self-pay

## 2021-09-22 ENCOUNTER — Other Ambulatory Visit (HOSPITAL_COMMUNITY): Payer: Self-pay

## 2021-09-24 ENCOUNTER — Other Ambulatory Visit (HOSPITAL_COMMUNITY): Payer: Self-pay

## 2021-09-25 ENCOUNTER — Other Ambulatory Visit (HOSPITAL_COMMUNITY): Payer: Self-pay

## 2021-10-10 ENCOUNTER — Other Ambulatory Visit (HOSPITAL_COMMUNITY): Payer: Self-pay

## 2021-10-15 ENCOUNTER — Other Ambulatory Visit (HOSPITAL_COMMUNITY): Payer: Self-pay

## 2021-10-15 MED ORDER — BUPRENORPHINE HCL-NALOXONE HCL 12-3 MG SL FILM
ORAL_FILM | SUBLINGUAL | 1 refills | Status: DC
  Filled 2021-10-15 – 2021-10-23 (×2): qty 60, 30d supply, fill #0
  Filled 2021-11-22: qty 8, 4d supply, fill #1
  Filled 2021-11-23: qty 52, 26d supply, fill #1

## 2021-10-17 ENCOUNTER — Other Ambulatory Visit (HOSPITAL_COMMUNITY): Payer: Self-pay

## 2021-10-23 ENCOUNTER — Other Ambulatory Visit (HOSPITAL_COMMUNITY): Payer: Self-pay

## 2021-10-24 ENCOUNTER — Other Ambulatory Visit (HOSPITAL_COMMUNITY): Payer: Self-pay

## 2021-11-13 ENCOUNTER — Other Ambulatory Visit (HOSPITAL_COMMUNITY): Payer: Self-pay

## 2021-11-23 ENCOUNTER — Other Ambulatory Visit (HOSPITAL_COMMUNITY): Payer: Self-pay

## 2021-11-26 ENCOUNTER — Other Ambulatory Visit (HOSPITAL_COMMUNITY): Payer: Self-pay

## 2021-12-19 NOTE — Progress Notes (Incomplete)
[facility]  [address] [place],  [place]  [address] [phone]  Clinic Day:  12/25/2021  Referring physician: [facility]*  This document serves as a record of services personally performed by Weston Settle, MD. It was created on their behalf by Curry,Lauren E, a trained medical scribe. The creation of this record is based on the scribe's personal observations and the provider's statements to them.  HISTORY OF PRESENT ILLNESS:  The patient is a 66 y.o. female  who I began seeing for leukopenia and thrombocytopenia.  She comes in today to reassess her counts.  Since her last visit, the patient has been doing well.  As it relates to her leukopenia and thrombocytopenia, she denies having any spontaneous infections or bleeding/bruising issues, respectively, which concern her for worsening peripheral counts.  Of note, this patient has a history of hepatitis C.  She took interferon shots 30 years ago for this, but does not know the current status of her hepatits C.  Hepatitis C testing was done at her last visit to ascertain her status.    PHYSICAL EXAM:  There were no vitals taken for this visit. Wt Readings from Last 3 Encounters:  12/25/20 117 lb (53.1 kg)  11/23/20 118 lb 6.4 oz (53.7 kg)   There is no height or weight on file to calculate BMI. Performance status (ECOG): 1 - Symptomatic but completely ambulatory Physical Exam Constitutional:      Appearance: Normal appearance. She is not ill-appearing.  HENT:     Mouth/Throat:     Mouth: Mucous membranes are moist.     Pharynx: Oropharynx is clear. No oropharyngeal exudate or posterior oropharyngeal erythema.  Cardiovascular:     Rate and Rhythm: Normal rate and regular rhythm.     Heart sounds: No murmur heard.   No friction rub. No gallop.  Pulmonary:     Effort: Pulmonary effort is normal. No respiratory distress.     Breath sounds: Normal breath sounds. No wheezing, rhonchi  or rales.  Abdominal:     General: Bowel sounds are normal. There is no distension.     Palpations: Abdomen is soft. There is no mass.     Tenderness: There is no abdominal tenderness.  Musculoskeletal:        General: No swelling.     Right lower leg: No edema.     Left lower leg: No edema.  Lymphadenopathy:     Cervical: No cervical adenopathy.     Upper Body:     Right upper body: No supraclavicular or axillary adenopathy.     Left upper body: No supraclavicular or axillary adenopathy.     Lower Body: No right inguinal adenopathy. No left inguinal adenopathy.  Skin:    General: Skin is warm.     Coloration: Skin is not jaundiced.     Findings: No lesion or rash.  Neurological:     General: No focal deficit present.     Mental Status: She is alert and oriented to person, place, and time. Mental status is at baseline.  Psychiatric:        Mood and Affect: Mood normal.        Behavior: Behavior normal.        Thought Content: Thought content normal.   LABS:    CBC Latest Ref Rng & Units 12/25/2020 11/23/2020 07/16/2012  WBC - 4.2 2.6 1.6(L)  Hemoglobin 12.0 - 16.0 13.4 12.7 10.1(L)  Hematocrit 36 - 46  39 37 30.4(L)  Platelets 150 - 399 98(A) 81(A) 61(L)   CMP Latest Ref Rng & Units 11/23/2020 07/16/2012 06/25/2012  Glucose 70 - 99 mg/dL - - -  BUN 4 - 21 18 - -  Creatinine 0.5 - 1.1 0.7 - -  Sodium 137 - 147 139 - -  Potassium 3.4 - 5.3 4.2 - -  Chloride 99 - 108 106 - -  CO2 13 - 22 27(A) - -  Calcium 8.7 - 10.7 9.7 - -  Total Protein 6.0 - 8.3 g/dL - 6.6 6.9  Total Bilirubin 0.3 - 1.2 mg/dL - 0.5 0.5  Alkaline Phos 25 - 125 47 75 83  AST 13 - 35 25 103(H) 84(H)  ALT 7 - 35 17 65(H) 55(H)    ASSESSMENT & PLAN:  [name] with both leukopenia and thrombocytopenia.  Her labs today show that her white count is back to normal.  Her platelets are only minimally low.  Overall, none of her counts is dangerously low to where her quality of life should be impacted.  Her  hepatitis C testing is encouraging, as it shows her viral load is essentially undetectable.  I still believe her chronically low counts are due to underlying liver damage from her hepatitis C.  However, as they are decent now, her counts will be followed conservatively. I will see her back in 1 year for repeat clinical assessment.  The patient understands all the plans discussed today and is in agreement with them.   I, Rita Ohara, am acting as scribe for Marice Potter, MD    I have reviewed this report as typed by the medical scribe, and it is complete and accurate.  Dequincy Macarthur Critchley, MD

## 2021-12-25 ENCOUNTER — Ambulatory Visit: Payer: Medicaid Other | Admitting: Oncology

## 2021-12-25 ENCOUNTER — Other Ambulatory Visit: Payer: Medicaid Other

## 2021-12-27 ENCOUNTER — Emergency Department (HOSPITAL_COMMUNITY): Payer: Medicaid Other

## 2021-12-27 ENCOUNTER — Other Ambulatory Visit (HOSPITAL_COMMUNITY): Payer: Self-pay

## 2021-12-27 ENCOUNTER — Emergency Department (HOSPITAL_COMMUNITY)
Admission: EM | Admit: 2021-12-27 | Discharge: 2021-12-27 | Disposition: A | Discharge status: Home / Self Care | Payer: Medicaid Other | Attending: Emergency Medicine | Admitting: Emergency Medicine

## 2021-12-27 ENCOUNTER — Encounter (HOSPITAL_COMMUNITY): Payer: Self-pay

## 2021-12-27 ENCOUNTER — Other Ambulatory Visit: Payer: Self-pay

## 2021-12-27 DIAGNOSIS — S50312A Abrasion of left elbow, initial encounter: Secondary | ICD-10-CM | POA: Diagnosis not present

## 2021-12-27 DIAGNOSIS — Y9301 Activity, walking, marching and hiking: Secondary | ICD-10-CM | POA: Diagnosis not present

## 2021-12-27 DIAGNOSIS — W01198A Fall on same level from slipping, tripping and stumbling with subsequent striking against other object, initial encounter: Secondary | ICD-10-CM | POA: Diagnosis not present

## 2021-12-27 DIAGNOSIS — S7002XA Contusion of left hip, initial encounter: Secondary | ICD-10-CM | POA: Diagnosis not present

## 2021-12-27 DIAGNOSIS — W19XXXA Unspecified fall, initial encounter: Secondary | ICD-10-CM

## 2021-12-27 DIAGNOSIS — S79912A Unspecified injury of left hip, initial encounter: Secondary | ICD-10-CM | POA: Diagnosis present

## 2021-12-27 LAB — COMPREHENSIVE METABOLIC PANEL
ALT: 16 U/L (ref 0–44)
AST: 17 U/L (ref 15–41)
Albumin: 4 g/dL (ref 3.5–5.0)
Alkaline Phosphatase: 45 U/L (ref 38–126)
Anion gap: 6 (ref 5–15)
BUN: 17 mg/dL (ref 8–23)
CO2: 26 mmol/L (ref 22–32)
Calcium: 9 mg/dL (ref 8.9–10.3)
Chloride: 107 mmol/L (ref 98–111)
Creatinine, Ser: 0.47 mg/dL (ref 0.44–1.00)
GFR, Estimated: >60 mL/min (ref >60)
Glucose, Bld: 120 mg/dL — ABNORMAL HIGH (ref 70–99)
Potassium: 3.8 mmol/L (ref 3.5–5.1)
Sodium: 139 mmol/L (ref 135–145)
Total Bilirubin: 0.3 mg/dL (ref 0.3–1.2)
Total Protein: 6.7 g/dL (ref 6.5–8.1)

## 2021-12-27 LAB — CBC WITH DIFFERENTIAL/PLATELET
Abs Immature Granulocytes: 0.02 10*3/uL (ref 0.00–0.07)
Basophils Absolute: 0 10*3/uL (ref 0.0–0.1)
Basophils Relative: 0 %
Eosinophils Absolute: 0.1 10*3/uL (ref 0.0–0.5)
Eosinophils Relative: 1 %
HCT: 32.3 % — ABNORMAL LOW (ref 36.0–46.0)
Hemoglobin: 11 g/dL — ABNORMAL LOW (ref 12.0–15.0)
Immature Granulocytes: 0 %
Lymphocytes Relative: 8 %
Lymphs Abs: 0.5 10*3/uL — ABNORMAL LOW (ref 0.7–4.0)
MCH: 29.4 pg (ref 26.0–34.0)
MCHC: 34.1 g/dL (ref 30.0–36.0)
MCV: 86.4 fL (ref 80.0–100.0)
Monocytes Absolute: 0.4 10*3/uL (ref 0.1–1.0)
Monocytes Relative: 6 %
Neutro Abs: 6 10*3/uL (ref 1.7–7.7)
Neutrophils Relative %: 85 %
Platelets: 93 10*3/uL — ABNORMAL LOW (ref 150–400)
RBC: 3.74 MIL/uL — ABNORMAL LOW (ref 3.87–5.11)
RDW: 12.6 % (ref 11.5–15.5)
WBC: 7 10*3/uL (ref 4.0–10.5)
nRBC: 0 % (ref 0.0–0.2)

## 2021-12-27 LAB — CK: Total CK: 83 U/L (ref 38–234)

## 2021-12-27 MED ORDER — SODIUM CHLORIDE 0.9 % IV BOLUS: 1000.0000 mL | Freq: Once | INTRAVENOUS | Status: DC

## 2021-12-27 MED ORDER — DIAZEPAM 5 MG/ML IJ SOLN
5.0000 mg | Freq: Once | INTRAMUSCULAR | Status: AC
Start: 2021-12-27 — End: 2021-12-27
  Administered 2021-12-27: 5 mg via INTRAVENOUS
  Filled 2021-12-27: qty 2

## 2021-12-27 MED ORDER — HYDROMORPHONE HCL 1 MG/ML IJ SOLN
1.0000 mg | Freq: Once | INTRAMUSCULAR | Status: AC
  Administered 2021-12-27: 1 mg via INTRAVENOUS
  Filled 2021-12-27: qty 1

## 2021-12-27 MED ORDER — GABAPENTIN 300 MG PO CAPS
300.0000 mg | ORAL_CAPSULE | Freq: Every day | ORAL | 1 refills | Status: AC
  Filled 2021-12-27 – 2022-01-11 (×2): qty 30, 30d supply, fill #0

## 2021-12-27 NOTE — ED Notes (Signed)
Pt transported to CT ?

## 2021-12-27 NOTE — Discharge Instructions (Signed)
You have a hip contusion. ? 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  If you need more pain medicine, you will need to talk to your pain management doctor. ? ?You can also take Tylenol or Motrin for pain. ? ?I contacted case management to deliver a walker if you needed. ? ?Return to ER if you have worse hip pain, another fall, abdominal pain, vomiting. ?

## 2021-12-27 NOTE — ED Triage Notes (Signed)
Patient had a fall around 30 minutes ago onto the concrete rocks. She fell on her left hip. 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  ?

## 2021-12-27 NOTE — ED Provider Notes (Signed)
?[facility]-EMERGENCY DEPT ?Provider Note ? ? ?CSN: [id number] ?Arrival date & time: 12/27/21  2035 ? ?  ? ?History ? ?Chief Complaint  ?Patient presents with  ? Fall  ? Hip Pain  ? ? ?Haifa Hatton is a 66 y.o. female hx of hep C, chronic pain on Suboxone, here with fall and hip pain.  Patient had previous stroke and is weak on the left side chronically.  Patient was walking her dog and tripped and fell onto the left hip.  Patient was noted to have a contusion of the left hip.  Patient states that she has hard time bearing weight on it afterwards.  Patient denies any head injury or loss of consciousness.  ? ?The history is provided by the patient.  ? ?  ? ?Home Medications ?Prior to Admission medications   ?Medication Sig Start Date End Date Taking? Authorizing Provider  ?budesonide-formoterol (SYMBICORT) 160-4.5 MCG/ACT inhaler INHALE 2 PUFFS TWICE DAILY 10/10/20 10/10/21  Eber Jones, NP  ?Buprenorphine HCl-Naloxone HCl (SUBOXONE) 12-3 MG FILM Dissolve 1 film under the tongue 2 times a day (allow to dissolve slowly in mouth without chewing or swallowing) 10/15/21     ?buprenorphine-naloxone (SUBOXONE) 8-2 MG SUBL Place 1 tablet under the tongue 2 (two) times daily.      [provider]  ?CHANTIX 1 MG tablet Take 1 mg by mouth daily. 07/31/16   [provider]  ?gabapentin (NEURONTIN) 300 MG capsule Take 300 mg by mouth 3 (three) times daily.      [provider]  ?gabapentin (NEURONTIN) 300 MG capsule TAKE 1 CAPSULE BY MOUTH ONCE DAILY 12/13/20 12/13/21  Eber Jones, NP  ?gabapentin (NEURONTIN) 300 MG capsule TAKE 1 CAPSULE BY MOUTH ONCE DAILY 12/27/21     ?ibuprofen (ADVIL) 800 MG tablet Take 1 tab(s) orally three times a day PRN 03/30/21     ?ibuprofen (ADVIL,MOTRIN) 600 MG tablet Take 600 mg by mouth 3 (three) times daily. 07/31/16   [provider]  ?promethazine (PHENERGAN) 25 MG tablet Take 1 tablet (25 mg total) by mouth every 6 (six) hours as needed  for nausea. 03/19/12 03/26/12  [name], MD  ?SUBOXONE 8-2 MG FILM Dissolve 1 film under the tongue 3 times a day. 07/25/21     ?   ? ?Allergies    ?Patient has no known allergies.   ? ?Review of Systems   ?Review of Systems  ?Musculoskeletal:   ?     Left hip pain  ?All other systems reviewed and are negative. ? ?Physical Exam ?Updated Vital Signs ?BP (!) 143/78 (BP Location: Right Arm)   Pulse 92   Temp 98.7 ?F (37.1 ?C) (Oral)   Resp 16   Ht 5\' 3"  (1.6 m)   Wt 52.2 kg   SpO2 96%   BMI 20.37 kg/m?  ?Physical Exam ?Vitals and nursing note reviewed.  ?Constitutional:   ?   Comments: Uncomfortable, chronically ill  ?HENT:  ?   Head: Normocephalic and atraumatic.  ?   Nose: Nose normal.  ?   Mouth/Throat:  ?   Mouth: Mucous membranes are moist.  ?Eyes:  ?   Extraocular Movements: Extraocular movements intact.  ?   Pupils: Pupils are equal, round, and reactive to light.  ?Cardiovascular:  ?   Rate and Rhythm: Normal rate and regular rhythm.  ?   Pulses: Normal pulses.  ?   Heart sounds: Normal heart sounds.  ?Pulmonary:  ?   Effort: Pulmonary  effort is normal.  ?   Breath sounds: Normal breath sounds.  ?Abdominal:  ?   General: Abdomen is flat.  ?   Palpations: Abdomen is soft.  ?Musculoskeletal:  ?   Cervical back: Normal range of motion and neck supple.  ?   Comments: Patient is contracted on the left side.  Patient has abrasion of the left elbow.  Patient also has contusion of the left hip area.  Patient has pain with left hip range of motion.  There is no spinal tenderness on exam.  ?Neurological:  ?   Comments: Patient is contracted the left side which is baseline.  ?Psychiatric:     ?   Mood and Affect: Mood normal.     ?   Behavior: Behavior normal.  ? ? ?ED Results / Procedures / Treatments   ?Labs ?(all labs ordered are listed, but only abnormal results are displayed) ?Labs Reviewed  ?CBC WITH DIFFERENTIAL/PLATELET  ?COMPREHENSIVE METABOLIC PANEL  ?CK  ? ? ?EKG ?None ? ?Radiology ?DG Hip Unilat W or  Wo Pelvis 2-3 Views Left ? ?Result Date: 12/27/2021 ?CLINICAL DATA:  Fall left hip pain EXAM: DG HIP (WITH OR WITHOUT PELVIS) 2-3V LEFT COMPARISON:  None. FINDINGS: Normal alignment. No acute fracture or dislocation. Moderate right hip degenerative arthritis. Left hip joint space is preserved. Sacroiliac joint spaces are preserved. Soft tissues are unremarkable. IMPRESSION: Asymmetric moderate right hip degenerative arthritis. Electronically Signed   By: Helyn Numbers M.D.   On: 12/27/2021 21:22   ? ?Procedures ?Procedures  ? ? ?Angiocath insertion ?Performed by: Richardean Canal ? ?Consent: Verbal consent obtained. ?Risks and benefits: risks, benefits and alternatives were discussed ?Time out: Immediately prior to procedure a "time out" was called to verify the correct patient, procedure, equipment, support staff and site/side marked as required. ? ?Preparation: Patient was prepped and draped in the usual sterile fashion. ? ?Vein Location: R forearm ? ?Ultrasound Guided ? ?Gauge: 20 long  ? ?Normal blood return and flush without difficulty ?Patient tolerance: Patient tolerated the procedure well with no immediate complications. ? ? ? ?Medications Ordered in ED ?Medications  ?HYDROmorphone (DILAUDID) injection 1 mg (has no administration in time range)  ?diazepam (VALIUM) injection 5 mg (has no administration in time range)  ? ? ?ED Course/ Medical Decision Making/ A&P ?  ?                        ?Medical Decision Making ?Benna Hasting is a 66 y.o. female here presenting with fall onto the left hip.  Patient also has left elbow abrasion.  Patient has no head injury.  Has no spinal tenderness.  I think she likely has hip contusion versus hip fracture.  We will get x-rays and if it is negative likely will need a CT of the head.  Will give pain medicine as well.  ? ?10:58 PM ?X-ray did not show a fracture and CT showed hematoma but no fracture.  Labs including CK level unremarkable. Patient's pain is better after pain  medicine.  Patient is on Suboxone at home.  Patient states that she may be able to borrow somebody's walker.  I left a message with case management to deliver a walker tomorrow.  I told patient that if she needs more pain medicine she will need to talk to her pain management doctor ? ?Problems Addressed: ?Contusion of left hip, initial encounter: acute illness or injury ?Fall, initial encounter: acute illness or injury ? ?  Amount and/or Complexity of Data Reviewed ?Labs: ordered. Decision-making details documented in ED Course. ?Radiology: ordered and independent interpretation performed. Decision-making details documented in ED Course. ? ?Risk ?Prescription drug management. ? ?Final Clinical Impression(s) / ED Diagnoses ?Final diagnoses:  ?None  ? ? ?Rx / DC Orders ?ED Discharge Orders   ? ? None  ? ?  ? ? ?  ?Charlynne Pander, MD ?12/27/21 2311 ? ?

## 2021-12-28 ENCOUNTER — Telehealth: Payer: Self-pay

## 2021-12-28 NOTE — Telephone Encounter (Signed)
RNCM received TOC consult after TOC hours for rolling walker. RNCM left voicemail message for patient, awaiting a call back.  ?

## 2022-01-02 ENCOUNTER — Telehealth: Payer: Self-pay

## 2022-01-02 NOTE — Telephone Encounter (Signed)
RNCM received call from patient requesting a rolling walker. RNCM left vm message for call back. ? ?This RNCM spoke with [facility] with [employer] who will send rollator to patient's home. ?

## 2022-01-03 ENCOUNTER — Telehealth: Payer: Self-pay

## 2022-01-03 NOTE — Telephone Encounter (Signed)
RNCM spoke with patient re: rollator. This RNCM advised patient I called her on yesterday and provided patient with this RNCM contact information. Patient states she has not checked her voicemail. This RNCM advised patient to check her voicemail for call from [employer]. If she did not get the voicemail she needs to contact her PCP since she is already discharged. ? ?No additional TOC needs at this time.TOC is signing off. ?

## 2022-01-03 NOTE — Telephone Encounter (Signed)
RNCM received inbound call from patient. Dana Knapp reports she did not have a voicemail from [employer]. This RNCM confirmed with Shelia at [employer] that a rollator walker for the patient has been set up for delivery to her home. This RNCM advised patient to give at least 24-48 hours before she get contact from [employer]. If she does not receive the walker she needs to contact her PCP for further assistance. ? ?No additional TOC needs at this time. ?

## 2022-01-04 ENCOUNTER — Other Ambulatory Visit (HOSPITAL_COMMUNITY): Payer: Self-pay

## 2022-01-10 ENCOUNTER — Other Ambulatory Visit (HOSPITAL_COMMUNITY): Payer: Self-pay

## 2022-01-10 MED ORDER — BUPRENORPHINE HCL-NALOXONE HCL 12-3 MG SL FILM
ORAL_FILM | SUBLINGUAL | 0 refills | Status: DC
  Filled 2022-01-10: qty 15, 8d supply, fill #0

## 2022-01-11 ENCOUNTER — Other Ambulatory Visit (HOSPITAL_COMMUNITY): Payer: Self-pay

## 2022-01-14 ENCOUNTER — Other Ambulatory Visit (HOSPITAL_COMMUNITY): Payer: Self-pay

## 2022-01-15 ENCOUNTER — Other Ambulatory Visit (HOSPITAL_COMMUNITY): Payer: Self-pay

## 2022-01-15 MED ORDER — TRAZODONE HCL 100 MG PO TABS
ORAL_TABLET | ORAL | 0 refills | Status: AC
  Filled 2022-01-15: qty 30, 30d supply, fill #0

## 2022-01-15 MED ORDER — BUPRENORPHINE HCL-NALOXONE HCL 12-3 MG SL FILM
ORAL_FILM | SUBLINGUAL | 0 refills | Status: DC
  Filled 2022-01-15: qty 15, 8d supply, fill #0
  Filled 2022-01-21: qty 15, 7d supply, fill #0

## 2022-01-16 ENCOUNTER — Other Ambulatory Visit (HOSPITAL_COMMUNITY): Payer: Self-pay

## 2022-01-16 MED ORDER — IBUPROFEN 800 MG PO TABS
ORAL_TABLET | ORAL | 0 refills | Status: AC
  Filled 2022-01-16: qty 15, 5d supply, fill #0

## 2022-01-17 ENCOUNTER — Other Ambulatory Visit (HOSPITAL_COMMUNITY): Payer: Self-pay

## 2022-01-17 MED ORDER — VITAMIN D3 1.25 MG (50000 UT) PO CAPS
ORAL_CAPSULE | ORAL | 0 refills | Status: AC
  Filled 2022-01-17: qty 13, 90d supply, fill #0

## 2022-01-21 ENCOUNTER — Other Ambulatory Visit (HOSPITAL_COMMUNITY): Payer: Self-pay

## 2022-01-22 ENCOUNTER — Other Ambulatory Visit (HOSPITAL_COMMUNITY): Payer: Self-pay

## 2022-01-23 ENCOUNTER — Other Ambulatory Visit (HOSPITAL_COMMUNITY): Payer: Self-pay

## 2022-01-23 MED ORDER — BUPRENORPHINE HCL-NALOXONE HCL 12-3 MG SL FILM
ORAL_FILM | SUBLINGUAL | 0 refills | Status: AC
  Filled 2022-01-30: qty 15, 7d supply, fill #0

## 2022-01-23 NOTE — Progress Notes (Signed)
?[facility]  ?[address] ?[place],  [place]  [address] ?[phone] ? ?Clinic Day:  01/24/2022 ? ?Referring physician: [facility]* ? ?HISTORY OF PRESENT ILLNESS:  ?The patient is a 66 y.o. female  with a history of leukopenia and thrombocytopenia.  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  Of note, her hepatitis C was treated back in 2013.  Testing done at her last visit showed her to have an absent hepatitis C viral load.  She comes in today to reassess her counts.  Since her last visit, the patient has been doing well.  As it relates to her leukopenia and thrombocytopenia, she denies having any spontaneous infections or bleeding/bruising issues, respectively, which concern her for worsening peripheral counts. ? ?PHYSICAL EXAM:  ?Blood pressure (!) 178/96, pulse 73, temperature 98 ?F (36.7 ?C), resp. rate 16, height 5' 2.5" (1.588 m), weight 127 lb (57.6 kg), SpO2 97 %. ?Wt Readings from Last 3 Encounters:  ?01/24/22 127 lb (57.6 kg)  ?12/27/21 115 lb (52.2 kg)  ?12/25/20 117 lb (53.1 kg)  ? ?Body mass index is 22.86 kg/m?Marland Kitchen ?Performance status (ECOG): 1 - Symptomatic but completely ambulatory ?Physical Exam ?Constitutional:   ?   Appearance: Normal appearance. She is not ill-appearing.  ?HENT:  ?   Mouth/Throat:  ?   Mouth: Mucous membranes are moist.  ?   Pharynx: Oropharynx is clear. No oropharyngeal exudate or posterior oropharyngeal erythema.  ?Cardiovascular:  ?   Rate and Rhythm: Normal rate and regular rhythm.  ?   Heart sounds: No murmur heard. ?  No friction rub. No gallop.  ?Pulmonary:  ?   Effort: Pulmonary effort is normal. No respiratory distress.  ?   Breath sounds: Normal breath sounds. No wheezing, rhonchi or rales.  ?Abdominal:  ?   General: Bowel sounds are normal. There is no distension.  ?   Palpations: Abdomen is soft. There is no mass.  ?   Tenderness: There is no abdominal tenderness.  ?Musculoskeletal:     ?    General: No swelling.  ?   Right lower leg: No edema.  ?   Left lower leg: No edema.  ?Lymphadenopathy:  ?   Cervical: No cervical adenopathy.  ?   Upper Body:  ?   Right upper body: No supraclavicular or axillary adenopathy.  ?   Left upper body: No supraclavicular or axillary adenopathy.  ?   Lower Body: No right inguinal adenopathy. No left inguinal adenopathy.  ?Skin: ?   General: Skin is warm.  ?   Coloration: Skin is not jaundiced.  ?   Findings: No lesion or rash.  ?Neurological:  ?   General: No focal deficit present.  ?   Mental Status: She is alert and oriented to person, place, and time. Mental status is at baseline.  ?Psychiatric:     ?   Mood and Affect: Mood normal.     ?   Behavior: Behavior normal.     ?   Thought Content: Thought content normal.  ? ?LABS:  ? ? ? 01/24/22 00:00  ?Iron 99 (E)  ?TIBC 284 (E)  ?%SAT 34.8 (E)  ?Ferritin 47.2 (E)  ?Folate 7.82 (E)  ?Vitamin B12 881 (E)  ? ?ASSESSMENT & PLAN:  ?[name] with a history of both leukopenia and thrombocytopenia.  Her labs today show that that she is mildly pancytopenic.  Again, this is likely related to her baseline  cirrhosis from hepatitis C.  Of note, the patient has not had any recent scans to better ascertain her liver and splenic architecture.  However, her counts today are not much different than what they have been over these past few years.  Labs today also did not show any nutritional deficiencies factoring into her pancytopenia.  As she is clinically doing well, she will continue to follow conservatively.  I will see her back in 1 year for repeat clinical assessment.  If her peripheral counts drop even further, repeat scans would need to be done to check for cirrhosis and secondary splenomegaly.  A bone marrow biopsy would also be considered if her peripheral counts worsen and there is no significant liver/splenic disease appreciated per future scans.  The patient understands all the plans discussed today and is in agreement  with them. ? ? ?Saiquan Hands Macarthur Critchley, MD   ? ? ?  ?

## 2022-01-24 ENCOUNTER — Other Ambulatory Visit: Payer: Self-pay | Admitting: Oncology

## 2022-01-24 ENCOUNTER — Inpatient Hospital Stay: Payer: Medicaid Other | Attending: Oncology

## 2022-01-24 ENCOUNTER — Inpatient Hospital Stay (INDEPENDENT_AMBULATORY_CARE_PROVIDER_SITE_OTHER): Payer: Medicaid Other | Admitting: Oncology

## 2022-01-24 VITALS — BP 178/96 | HR 73 | Temp 98.0°F | Resp 16 | Ht 62.5 in | Wt 127.0 lb

## 2022-01-24 DIAGNOSIS — D696 Thrombocytopenia, unspecified: Secondary | ICD-10-CM

## 2022-01-24 DIAGNOSIS — D61818 Other pancytopenia: Secondary | ICD-10-CM

## 2022-01-24 LAB — CBC AND DIFFERENTIAL
HCT: 36 (ref 36–46)
Hemoglobin: 11.7 — AB (ref 12.0–16.0)
Neutrophils Absolute: 1.89
Platelets: 87 10*3/uL — AB (ref 150–400)
WBC: 2.9

## 2022-01-24 LAB — BASIC METABOLIC PANEL
BUN: 10 (ref 4–21)
CO2: 29 — AB (ref 13–22)
Chloride: 106 (ref 99–108)
Creatinine: 0.5 (ref 0.5–1.1)
Glucose: 111
Potassium: 3.6 mEq/L (ref 3.5–5.1)
Sodium: 139 (ref 137–147)

## 2022-01-24 LAB — HEPATIC FUNCTION PANEL
ALT: 23 U/L (ref 7–35)
AST: 32 (ref 13–35)
Alkaline Phosphatase: 51 (ref 25–125)
Bilirubin, Total: 0.6

## 2022-01-24 LAB — CBC: RBC: 4.07 (ref 3.87–5.11)

## 2022-01-24 LAB — IRON,TIBC AND FERRITIN PANEL
%SAT: 34.8
Ferritin: 47.2
Iron: 99
TIBC: 284

## 2022-01-24 LAB — COMPREHENSIVE METABOLIC PANEL
Albumin: 4.2 (ref 3.5–5.0)
Calcium: 8.6 — AB (ref 8.7–10.7)

## 2022-01-24 LAB — VITAMIN B12: Vitamin B-12: 881

## 2022-01-25 LAB — FOLATE: Folate: 7.82

## 2022-01-30 ENCOUNTER — Other Ambulatory Visit (HOSPITAL_COMMUNITY): Payer: Self-pay

## 2022-01-30 MED ORDER — BUPRENORPHINE HCL-NALOXONE HCL 12-3 MG SL FILM
ORAL_FILM | SUBLINGUAL | 0 refills | Status: DC
  Filled 2022-02-05: qty 30, 15d supply, fill #0

## 2022-01-31 ENCOUNTER — Other Ambulatory Visit (HOSPITAL_COMMUNITY): Payer: Self-pay

## 2022-02-04 ENCOUNTER — Other Ambulatory Visit (HOSPITAL_COMMUNITY): Payer: Self-pay | Admitting: Family Medicine

## 2022-02-04 ENCOUNTER — Other Ambulatory Visit: Payer: Self-pay | Admitting: Family Medicine

## 2022-02-04 DIAGNOSIS — S79912D Unspecified injury of left hip, subsequent encounter: Secondary | ICD-10-CM

## 2022-02-05 ENCOUNTER — Other Ambulatory Visit (HOSPITAL_COMMUNITY): Payer: Self-pay

## 2022-02-06 ENCOUNTER — Other Ambulatory Visit (HOSPITAL_COMMUNITY): Payer: Self-pay

## 2022-02-20 ENCOUNTER — Other Ambulatory Visit: Payer: Self-pay

## 2022-02-26 ENCOUNTER — Ambulatory Visit (HOSPITAL_COMMUNITY): Payer: Medicaid Other

## 2022-03-05 ENCOUNTER — Ambulatory Visit (HOSPITAL_COMMUNITY)
Admission: RE | Admit: 2022-03-05 | Discharge: 2022-03-05 | Disposition: A | Discharge status: Home / Self Care | Payer: Medicaid Other | Source: Ambulatory Visit | Attending: Family Medicine | Admitting: Family Medicine

## 2022-03-05 ENCOUNTER — Other Ambulatory Visit (HOSPITAL_COMMUNITY): Payer: Self-pay | Admitting: Family Medicine

## 2022-03-05 DIAGNOSIS — M25551 Pain in right hip: Secondary | ICD-10-CM

## 2022-03-05 DIAGNOSIS — S79912D Unspecified injury of left hip, subsequent encounter: Secondary | ICD-10-CM | POA: Insufficient documentation

## 2022-08-08 ENCOUNTER — Other Ambulatory Visit: Payer: Medicaid Other

## 2022-08-08 ENCOUNTER — Ambulatory Visit: Payer: Medicaid Other | Admitting: Oncology

## 2022-08-12 ENCOUNTER — Ambulatory Visit: Payer: Medicaid Other | Admitting: Oncology

## 2022-08-12 ENCOUNTER — Other Ambulatory Visit: Payer: Medicaid Other

## 2022-08-15 ENCOUNTER — Other Ambulatory Visit: Payer: Self-pay | Admitting: Oncology

## 2022-08-15 DIAGNOSIS — D61818 Other pancytopenia: Secondary | ICD-10-CM

## 2022-08-15 NOTE — Progress Notes (Signed)
[name]  [address] [place],  [place]  [address] [phone]  Clinic Day:  08/16/2022  Referring physician: Eber Jones, NP  HISTORY OF PRESENT ILLNESS:  The patient is a 66 y.o. female  with a history of leukopenia and thrombocytopenia.  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  Of note, her hepatitis C was treated back in 2013.  Testing done at her last visit showed her to have an absent hepatitis C viral load.  She comes in today to reassess her counts after recent labs showed a platelet count of 97.   Since her last visit, the patient has been doing well.  As it relates to her leukopenia and thrombocytopenia, she denies having any spontaneous infections or bleeding/bruising issues, respectively, which concern her for worsening peripheral counts.  PHYSICAL EXAM:  Blood pressure (!) 176/84, pulse 71, temperature 98.3 F (36.8 C), resp. rate 14, height 5' 2.5" (1.588 m), weight 116 lb 11.2 oz (52.9 kg), SpO2 94 %. Wt Readings from Last 3 Encounters:  08/16/22 116 lb 11.2 oz (52.9 kg)  01/24/22 127 lb (57.6 kg)  12/27/21 115 lb (52.2 kg)   Body mass index is 21 kg/m. Performance status (ECOG): 1 - Symptomatic but completely ambulatory Physical Exam Constitutional:      Appearance: Normal appearance. She is not ill-appearing.  HENT:     Mouth/Throat:     Mouth: Mucous membranes are moist.     Pharynx: Oropharynx is clear. No oropharyngeal exudate or posterior oropharyngeal erythema.  Cardiovascular:     Rate and Rhythm: Normal rate and regular rhythm.     Heart sounds: No murmur heard.    No friction rub. No gallop.  Pulmonary:     Effort: Pulmonary effort is normal. No respiratory distress.     Breath sounds: Normal breath sounds. No wheezing, rhonchi or rales.  Abdominal:     General: Bowel sounds are normal. There is no distension.     Palpations: Abdomen is soft. There is no mass.      Tenderness: There is no abdominal tenderness.  Musculoskeletal:        General: No swelling.     Right lower leg: No edema.     Left lower leg: No edema.  Lymphadenopathy:     Cervical: No cervical adenopathy.     Upper Body:     Right upper body: No supraclavicular or axillary adenopathy.     Left upper body: No supraclavicular or axillary adenopathy.     Lower Body: No right inguinal adenopathy. No left inguinal adenopathy.  Skin:    General: Skin is warm.     Coloration: Skin is not jaundiced.     Findings: No lesion or rash.  Neurological:     General: No focal deficit present.     Mental Status: She is alert and oriented to person, place, and time. Mental status is at baseline.  Psychiatric:        Mood and Affect: Mood normal.        Behavior: Behavior normal.        Thought Content: Thought content normal.   LABS:    Latest Reference Range & Units 08/16/22 00:00  WBC  3.7 (E)  RBC 3.87 - 5.11  4.13 (E)  Hemoglobin 12.0 - 16.0  12.3 (E)  HCT 36 - 46  35 ! (E)  Platelets 150 - 400 K/uL 99 ! (E)  !: Data  is abnormal (E): External lab result    Latest Reference Range & Units 08/16/22 14:52  Iron 28 - 170 ug/dL 117  UIBC ug/dL 171  TIBC 250 - 450 ug/dL 288  Saturation Ratios 10.4 - 31.8 % 41 (H)  Ferritin 11 - 307 ng/mL 73  Folate >5.9 ng/mL 8.4  Vitamin B12 180 - 914 pg/mL 111 (L)  (H): Data is abnormally high (L): Data is abnormally low    Latest Reference Range & Units 08/16/22 00:00  Sodium 137 - 147  136 ! (E)  Potassium 3.5 - 5.1 mEq/L 3.6 (E)  Chloride 99 - 108  105 (E)  CO2 13 - 22  25 ! (E)  Glucose  93 (E)  BUN 4 - 21  20 (E)  Creatinine 0.5 - 1.1  0.6 (E)  Calcium 8.7 - 10.7  9.4 (E)  Alkaline Phosphatase 25 - 125  41 (E)  Albumin 3.5 - 5.0  4.3 (E)  AST 13 - 35  24 (E)  ALT 7 - 35 U/L 18 (E)  !: Data is abnormal (E): External lab result ASSESSMENT & PLAN:  [name] with a history of both leukopenia and thrombocytopenia.  Her labs  today are essentially unchanged versus what they have been previously.  She understands her blood counts are related to her baseline cirrhosis from hepatitis C.  When evaluating her other labs today, her vitamin B12 level came back low.  This level is in stark contrast to 881 it was earlier this year.  Nevertheless, as her labs now show evidence of B12 deficiency, I will place her on a protracted course of B12 injections to ensure adequate fortification of her cobalamin stores.  This will consist of her receiving 1000 mcg daily x 7 days, then weekly x 4 weeks, then monthly indefinitely. Otherwise, as per peripheral counts are essentially unchanged versus what they have been over these past few years, they will continue to be followed conservatively.  I will see her back in 1 year for repeat clinical assessment.  The patient understands all the plans discussed today and is in agreement with them.   Dana Miedema Macarthur Critchley, MD

## 2022-08-16 ENCOUNTER — Other Ambulatory Visit: Payer: Self-pay | Admitting: Oncology

## 2022-08-16 ENCOUNTER — Inpatient Hospital Stay: Payer: Medicaid Other | Attending: Oncology

## 2022-08-16 ENCOUNTER — Telehealth: Payer: Self-pay | Admitting: Oncology

## 2022-08-16 ENCOUNTER — Inpatient Hospital Stay (INDEPENDENT_AMBULATORY_CARE_PROVIDER_SITE_OTHER): Payer: Medicaid Other | Admitting: Oncology

## 2022-08-16 VITALS — BP 176/84 | HR 71 | Temp 98.3°F | Resp 14 | Ht 62.5 in | Wt 116.7 lb

## 2022-08-16 DIAGNOSIS — E538 Deficiency of other specified B group vitamins: Secondary | ICD-10-CM | POA: Diagnosis present

## 2022-08-16 DIAGNOSIS — D696 Thrombocytopenia, unspecified: Secondary | ICD-10-CM

## 2022-08-16 DIAGNOSIS — D72819 Decreased white blood cell count, unspecified: Secondary | ICD-10-CM | POA: Diagnosis present

## 2022-08-16 DIAGNOSIS — B1920 Unspecified viral hepatitis C without hepatic coma: Secondary | ICD-10-CM | POA: Diagnosis not present

## 2022-08-16 DIAGNOSIS — D61818 Other pancytopenia: Secondary | ICD-10-CM

## 2022-08-16 LAB — CBC AND DIFFERENTIAL
HCT: 35 — AB (ref 36–46)
Hemoglobin: 12.3 (ref 12.0–16.0)
Neutrophils Absolute: 2.33
Platelets: 99 10*3/uL — AB (ref 150–400)
WBC: 3.7

## 2022-08-16 LAB — IRON AND TIBC
Iron: 117 ug/dL (ref 28–170)
Saturation Ratios: 41 % — ABNORMAL HIGH (ref 10.4–31.8)
TIBC: 288 ug/dL (ref 250–450)
UIBC: 171 ug/dL

## 2022-08-16 LAB — BASIC METABOLIC PANEL
BUN: 20 (ref 4–21)
CO2: 25 — AB (ref 13–22)
Chloride: 105 (ref 99–108)
Creatinine: 0.6 (ref 0.5–1.1)
Glucose: 93
Potassium: 3.6 mEq/L (ref 3.5–5.1)
Sodium: 136 — AB (ref 137–147)

## 2022-08-16 LAB — COMPREHENSIVE METABOLIC PANEL
Albumin: 4.3 (ref 3.5–5.0)
Calcium: 9.4 (ref 8.7–10.7)

## 2022-08-16 LAB — FOLATE: Folate: 8.4 ng/mL (ref >5.9)

## 2022-08-16 LAB — FERRITIN: Ferritin: 73 ng/mL (ref 11–307)

## 2022-08-16 LAB — CBC: RBC: 4.13 (ref 3.87–5.11)

## 2022-08-16 LAB — HEPATIC FUNCTION PANEL
ALT: 18 U/L (ref 7–35)
AST: 24 (ref 13–35)
Alkaline Phosphatase: 41 (ref 25–125)
Bilirubin, Total: 0.6

## 2022-08-16 LAB — VITAMIN B12: Vitamin B-12: 111 pg/mL — ABNORMAL LOW (ref 180–914)

## 2022-08-16 NOTE — Telephone Encounter (Signed)
08/16/22 NEXT APPT SCHEDULED AND CONFIRMED WITH PATIENT 

## 2022-08-25 DIAGNOSIS — E538 Deficiency of other specified B group vitamins: Secondary | ICD-10-CM | POA: Insufficient documentation

## 2022-08-29 ENCOUNTER — Encounter: Payer: Self-pay | Admitting: Oncology

## 2022-09-02 ENCOUNTER — Inpatient Hospital Stay: Payer: Medicaid Other | Attending: Oncology

## 2022-09-02 VITALS — BP 137/71 | HR 78 | Temp 97.2°F | Resp 15 | Wt 116.1 lb

## 2022-09-02 DIAGNOSIS — D696 Thrombocytopenia, unspecified: Secondary | ICD-10-CM | POA: Insufficient documentation

## 2022-09-02 DIAGNOSIS — D72819 Decreased white blood cell count, unspecified: Secondary | ICD-10-CM | POA: Insufficient documentation

## 2022-09-02 DIAGNOSIS — E538 Deficiency of other specified B group vitamins: Secondary | ICD-10-CM | POA: Diagnosis not present

## 2022-09-02 MED ORDER — CYANOCOBALAMIN 1000 MCG/ML IJ SOLN
1000.0000 ug | Freq: Once | INTRAMUSCULAR | Status: AC
  Administered 2022-09-02: 1000 ug via INTRAMUSCULAR
  Filled 2022-09-02: qty 1

## 2022-09-02 NOTE — Patient Instructions (Signed)

## 2022-09-03 ENCOUNTER — Inpatient Hospital Stay: Payer: Medicaid Other

## 2022-09-03 VITALS — BP 152/74 | HR 74 | Temp 98.3°F | Resp 16

## 2022-09-03 DIAGNOSIS — E538 Deficiency of other specified B group vitamins: Secondary | ICD-10-CM

## 2022-09-03 MED ORDER — CYANOCOBALAMIN 1000 MCG/ML IJ SOLN
1000.0000 ug | Freq: Once | INTRAMUSCULAR | Status: AC
  Administered 2022-09-03: 1000 ug via INTRAMUSCULAR
  Filled 2022-09-03: qty 1

## 2022-09-03 NOTE — Patient Instructions (Signed)

## 2022-09-04 ENCOUNTER — Inpatient Hospital Stay: Payer: Medicaid Other

## 2022-09-04 VITALS — BP 158/88 | HR 78 | Temp 98.3°F | Resp 16

## 2022-09-04 DIAGNOSIS — E538 Deficiency of other specified B group vitamins: Secondary | ICD-10-CM | POA: Diagnosis not present

## 2022-09-04 MED ORDER — CYANOCOBALAMIN 1000 MCG/ML IJ SOLN
1000.0000 ug | Freq: Once | INTRAMUSCULAR | Status: AC
  Administered 2022-09-04: 1000 ug via INTRAMUSCULAR
  Filled 2022-09-04: qty 1

## 2022-09-04 NOTE — Patient Instructions (Signed)

## 2022-09-05 ENCOUNTER — Inpatient Hospital Stay: Payer: Medicaid Other

## 2022-09-05 VITALS — BP 137/89 | HR 70 | Temp 98.2°F | Resp 18 | Ht 62.5 in

## 2022-09-05 DIAGNOSIS — E538 Deficiency of other specified B group vitamins: Secondary | ICD-10-CM | POA: Diagnosis not present

## 2022-09-05 MED ORDER — CYANOCOBALAMIN 1000 MCG/ML IJ SOLN
1000.0000 ug | Freq: Once | INTRAMUSCULAR | Status: AC
  Administered 2022-09-05: 1000 ug via INTRAMUSCULAR
  Filled 2022-09-05: qty 1

## 2022-09-05 NOTE — Patient Instructions (Signed)

## 2022-09-06 ENCOUNTER — Inpatient Hospital Stay: Payer: Medicaid Other

## 2022-09-06 VITALS — BP 145/83 | HR 79 | Temp 97.7°F | Resp 18 | Ht 62.5 in | Wt 116.1 lb

## 2022-09-06 DIAGNOSIS — E538 Deficiency of other specified B group vitamins: Secondary | ICD-10-CM | POA: Diagnosis not present

## 2022-09-06 MED ORDER — CYANOCOBALAMIN 1000 MCG/ML IJ SOLN
1000.0000 ug | Freq: Once | INTRAMUSCULAR | Status: AC
  Administered 2022-09-06: 1000 ug via INTRAMUSCULAR
  Filled 2022-09-06: qty 1

## 2022-09-06 NOTE — Patient Instructions (Signed)

## 2022-09-09 ENCOUNTER — Inpatient Hospital Stay: Payer: Medicaid Other

## 2022-09-10 ENCOUNTER — Ambulatory Visit: Payer: Medicaid Other

## 2022-09-10 ENCOUNTER — Encounter: Payer: Self-pay | Admitting: Oncology

## 2022-09-11 ENCOUNTER — Inpatient Hospital Stay: Payer: Medicaid Other

## 2022-09-17 ENCOUNTER — Inpatient Hospital Stay: Payer: Medicaid Other

## 2022-09-17 DIAGNOSIS — E538 Deficiency of other specified B group vitamins: Secondary | ICD-10-CM

## 2022-09-17 MED ORDER — CYANOCOBALAMIN 1000 MCG/ML IJ SOLN
1000.0000 ug | Freq: Once | INTRAMUSCULAR | Status: AC
  Administered 2022-09-17: 1000 ug via INTRAMUSCULAR
  Filled 2022-09-17: qty 1

## 2022-09-23 ENCOUNTER — Encounter: Payer: Self-pay | Admitting: Oncology

## 2022-09-24 ENCOUNTER — Ambulatory Visit: Payer: Medicaid Other

## 2022-09-24 ENCOUNTER — Other Ambulatory Visit: Payer: Self-pay

## 2022-09-24 ENCOUNTER — Inpatient Hospital Stay: Payer: Medicaid Other

## 2022-09-24 DIAGNOSIS — E538 Deficiency of other specified B group vitamins: Secondary | ICD-10-CM

## 2022-09-24 MED ORDER — CYANOCOBALAMIN 1000 MCG/ML IJ SOLN
1000.0000 ug | Freq: Once | INTRAMUSCULAR | Status: AC
  Administered 2022-09-24: 1000 ug via INTRAMUSCULAR
  Filled 2022-09-24: qty 1

## 2022-10-01 ENCOUNTER — Inpatient Hospital Stay: Payer: Medicaid Other | Attending: Oncology

## 2022-10-01 ENCOUNTER — Ambulatory Visit: Payer: Medicaid Other

## 2022-10-01 DIAGNOSIS — D696 Thrombocytopenia, unspecified: Secondary | ICD-10-CM | POA: Insufficient documentation

## 2022-10-01 DIAGNOSIS — E538 Deficiency of other specified B group vitamins: Secondary | ICD-10-CM | POA: Insufficient documentation

## 2022-10-01 DIAGNOSIS — D72819 Decreased white blood cell count, unspecified: Secondary | ICD-10-CM | POA: Insufficient documentation

## 2022-10-08 ENCOUNTER — Ambulatory Visit: Payer: Medicaid Other

## 2022-10-08 ENCOUNTER — Other Ambulatory Visit: Payer: Self-pay

## 2022-10-08 ENCOUNTER — Inpatient Hospital Stay: Payer: Medicaid Other

## 2022-10-08 DIAGNOSIS — D72819 Decreased white blood cell count, unspecified: Secondary | ICD-10-CM | POA: Diagnosis present

## 2022-10-08 DIAGNOSIS — E538 Deficiency of other specified B group vitamins: Secondary | ICD-10-CM

## 2022-10-08 DIAGNOSIS — D696 Thrombocytopenia, unspecified: Secondary | ICD-10-CM | POA: Diagnosis present

## 2022-10-08 MED ORDER — CYANOCOBALAMIN 1000 MCG/ML IJ SOLN
1000.0000 ug | Freq: Once | INTRAMUSCULAR | Status: AC
  Administered 2022-10-08: 1000 ug via INTRAMUSCULAR
  Filled 2022-10-08: qty 1

## 2022-10-15 ENCOUNTER — Other Ambulatory Visit: Payer: Self-pay

## 2022-10-15 ENCOUNTER — Inpatient Hospital Stay: Payer: Medicaid Other

## 2022-10-15 DIAGNOSIS — E538 Deficiency of other specified B group vitamins: Secondary | ICD-10-CM

## 2022-10-15 MED ORDER — CYANOCOBALAMIN 1000 MCG/ML IJ SOLN
1000.0000 ug | Freq: Once | INTRAMUSCULAR | Status: AC
  Administered 2022-10-15: 1000 ug via INTRAMUSCULAR
  Filled 2022-10-15: qty 1

## 2022-10-15 NOTE — Patient Instructions (Signed)
Vitamin B12 Deficiency Vitamin B12 deficiency occurs when the body does not have enough of this important vitamin. The body needs this vitamin: To make red blood cells. To make DNA. This is the genetic material inside cells. To help the nerves work properly so they can carry messages from the brain to the body. Vitamin B12 deficiency can cause health problems, such as not having enough red blood cells in the blood (anemia). This can lead to nerve damage if untreated. What are the causes? This condition may be caused by: Not eating enough foods that contain vitamin B12. Not having enough stomach acid and digestive fluids to properly absorb vitamin B12 from the food that you eat. Having certain diseases that make it hard to absorb vitamin B12. These diseases include Crohn's disease, chronic pancreatitis, and cystic fibrosis. An autoimmune disorder in which the body does not make enough of a protein (intrinsic factor) within the stomach, resulting in not enough absorption of vitamin B12. Having a surgery in which part of the stomach or small intestine is removed. Taking certain medicines that make it hard for the body to absorb vitamin B12. These include: Heartburn medicines, such as antacids and proton pump inhibitors. Some medicines that are used to treat diabetes. What increases the risk? The following factors may make you more likely to develop a vitamin B12 deficiency: Being an older adult. Eating a vegetarian or vegan diet that does not include any foods that come from animals. Eating a poor diet while you are pregnant. Taking certain medicines. Having alcoholism. What are the signs or symptoms? In some cases, there are no symptoms of this condition. If the condition leads to anemia or nerve damage, various symptoms may occur, such as: Weakness. Tiredness (fatigue). Loss of appetite. Numbness or tingling in your hands and feet. Redness and burning of the tongue. Depression,  confusion, or memory problems. Trouble walking. If anemia is severe, symptoms can include: Shortness of breath. Dizziness. Rapid heart rate. How is this diagnosed? This condition may be diagnosed with a blood test to measure the level of vitamin B12 in your blood. You may also have other tests, including: A group of tests that measure certain characteristics of blood cells (complete blood count, CBC). A blood test to measure intrinsic factor. A procedure where a thin tube with a camera on the end is used to look into your stomach or intestines (endoscopy). Other tests may be needed to discover the cause of the deficiency. How is this treated? Treatment for this condition depends on the cause. This condition may be treated by: Changing your eating and drinking habits, such as: Eating more foods that contain vitamin B12. Drinking less alcohol or no alcohol. Getting vitamin B12 injections. Taking vitamin B12 supplements by mouth (orally). Your health care provider will tell you which dose is best for you. Follow these instructions at home: Eating and drinking  Include foods in your diet that come from animals and contain a lot of vitamin B12. These include: Meats and poultry. This includes beef, pork, chicken, turkey, and organ meats, such as liver. Seafood. This includes clams, rainbow trout, salmon, tuna, and haddock. Eggs. Dairy foods such as milk, yogurt, and cheese. Eat foods that have vitamin B12 added to them (are fortified), such as ready-to-eat breakfast cereals. Check the label on the package to see if a food is fortified. The items listed above may not be a complete list of foods and beverages you can eat and drink. Contact a dietitian for   more information. Alcohol use Do not drink alcohol if: Your health care provider tells you not to drink. You are pregnant, may be pregnant, or are planning to become pregnant. If you drink alcohol: Limit how much you have to: 0-1 drink a  day for women. 0-2 drinks a day for men. Know how much alcohol is in your drink. In the [place] one drink equals one 12 oz bottle of beer (355 mL), one 5 oz glass of wine (148 mL), or one 1 oz glass of hard liquor (44 mL). General instructions Get vitamin B12 injections if told to by your health care provider. Take supplements only as told by your health care provider. Follow the directions carefully. Keep all follow-up visits. This is important. Contact a health care provider if: Your symptoms come back. Your symptoms get worse or do not improve with treatment. Get help right away: You develop shortness of breath. You have a rapid heart rate. You have chest pain. You become dizzy or you faint. These symptoms may be an emergency. Get help right away. Call 911. Do not wait to see if the symptoms will go away. Do not drive yourself to the hospital. Summary Vitamin B12 deficiency occurs when the body does not have enough of this important vitamin. Common causes include not eating enough foods that contain vitamin B12, not being able to absorb vitamin B12 from the food that you eat, having a surgery in which part of the stomach or small intestine is removed, or taking certain medicines. Eat foods that have vitamin B12 in them. Treatment may include making a change in the way you eat and drink, getting vitamin B12 injections, or taking vitamin B12 supplements. This information is not intended to replace advice given to you by your health care provider. Make sure you discuss any questions you have with your health care provider. Document Revised: 06/08/2021 Document Reviewed: 06/08/2021 Elsevier Patient Education  2023 [employer]  

## 2022-11-05 ENCOUNTER — Encounter: Payer: Self-pay | Admitting: Oncology

## 2022-11-08 ENCOUNTER — Ambulatory Visit: Payer: Medicaid Other

## 2022-11-12 ENCOUNTER — Other Ambulatory Visit: Payer: Self-pay

## 2022-11-12 ENCOUNTER — Inpatient Hospital Stay: Payer: Medicare Other | Attending: Oncology

## 2022-11-12 DIAGNOSIS — E538 Deficiency of other specified B group vitamins: Secondary | ICD-10-CM | POA: Diagnosis not present

## 2022-11-12 DIAGNOSIS — D696 Thrombocytopenia, unspecified: Secondary | ICD-10-CM | POA: Diagnosis present

## 2022-11-12 DIAGNOSIS — D72819 Decreased white blood cell count, unspecified: Secondary | ICD-10-CM | POA: Diagnosis present

## 2022-11-12 MED ORDER — CYANOCOBALAMIN 1000 MCG/ML IJ SOLN
1000.0000 ug | Freq: Once | INTRAMUSCULAR | Status: AC
  Administered 2022-11-12: 1000 ug via INTRAMUSCULAR
  Filled 2022-11-12: qty 1

## 2022-12-09 ENCOUNTER — Ambulatory Visit: Payer: Medicaid Other

## 2022-12-17 ENCOUNTER — Inpatient Hospital Stay: Payer: Medicare Other | Attending: Oncology

## 2022-12-17 ENCOUNTER — Other Ambulatory Visit: Payer: Self-pay

## 2022-12-17 DIAGNOSIS — E538 Deficiency of other specified B group vitamins: Secondary | ICD-10-CM | POA: Diagnosis present

## 2022-12-17 DIAGNOSIS — D72819 Decreased white blood cell count, unspecified: Secondary | ICD-10-CM | POA: Diagnosis present

## 2022-12-17 DIAGNOSIS — D696 Thrombocytopenia, unspecified: Secondary | ICD-10-CM | POA: Insufficient documentation

## 2022-12-17 MED ORDER — CYANOCOBALAMIN 1000 MCG/ML IJ SOLN
1000.0000 ug | Freq: Once | INTRAMUSCULAR | Status: AC
  Administered 2022-12-17: 1000 ug via INTRAMUSCULAR
  Filled 2022-12-17: qty 1

## 2022-12-17 NOTE — Patient Instructions (Signed)
Vitamin B12 Deficiency Vitamin B12 deficiency occurs when the body does not have enough of this important vitamin. The body needs this vitamin: To make red blood cells. To make DNA. This is the genetic material inside cells. To help the nerves work properly so they can carry messages from the brain to the body. Vitamin B12 deficiency can cause health problems, such as not having enough red blood cells in the blood (anemia). This can lead to nerve damage if untreated. What are the causes? This condition may be caused by: Not eating enough foods that contain vitamin B12. Not having enough stomach acid and digestive fluids to properly absorb vitamin B12 from the food that you eat. Having certain diseases that make it hard to absorb vitamin B12. These diseases include Crohn's disease, chronic pancreatitis, and cystic fibrosis. An autoimmune disorder in which the body does not make enough of a protein (intrinsic factor) within the stomach, resulting in not enough absorption of vitamin B12. Having a surgery in which part of the stomach or small intestine is removed. Taking certain medicines that make it hard for the body to absorb vitamin B12. These include: Heartburn medicines, such as antacids and proton pump inhibitors. Some medicines that are used to treat diabetes. What increases the risk? The following factors may make you more likely to develop a vitamin B12 deficiency: Being an older adult. Eating a vegetarian or vegan diet that does not include any foods that come from animals. Eating a poor diet while you are pregnant. Taking certain medicines. Having alcoholism. What are the signs or symptoms? In some cases, there are no symptoms of this condition. If the condition leads to anemia or nerve damage, various symptoms may occur, such as: Weakness. Tiredness (fatigue). Loss of appetite. Numbness or tingling in your hands and feet. Redness and burning of the tongue. Depression,  confusion, or memory problems. Trouble walking. If anemia is severe, symptoms can include: Shortness of breath. Dizziness. Rapid heart rate. How is this diagnosed? This condition may be diagnosed with a blood test to measure the level of vitamin B12 in your blood. You may also have other tests, including: A group of tests that measure certain characteristics of blood cells (complete blood count, CBC). A blood test to measure intrinsic factor. A procedure where a thin tube with a camera on the end is used to look into your stomach or intestines (endoscopy). Other tests may be needed to discover the cause of the deficiency. How is this treated? Treatment for this condition depends on the cause. This condition may be treated by: Changing your eating and drinking habits, such as: Eating more foods that contain vitamin B12. Drinking less alcohol or no alcohol. Getting vitamin B12 injections. Taking vitamin B12 supplements by mouth (orally). Your health care provider will tell you which dose is best for you. Follow these instructions at home: Eating and drinking  Include foods in your diet that come from animals and contain a lot of vitamin B12. These include: Meats and poultry. This includes beef, pork, chicken, turkey, and organ meats, such as liver. Seafood. This includes clams, rainbow trout, salmon, tuna, and haddock. Eggs. Dairy foods such as milk, yogurt, and cheese. Eat foods that have vitamin B12 added to them (are fortified), such as ready-to-eat breakfast cereals. Check the label on the package to see if a food is fortified. The items listed above may not be a complete list of foods and beverages you can eat and drink. Contact a dietitian for   more information. Alcohol use Do not drink alcohol if: Your health care provider tells you not to drink. You are pregnant, may be pregnant, or are planning to become pregnant. If you drink alcohol: Limit how much you have to: 0-1 drink a  day for women. 0-2 drinks a day for men. Know how much alcohol is in your drink. In the [place] one drink equals one 12 oz bottle of beer (355 mL), one 5 oz glass of wine (148 mL), or one 1 oz glass of hard liquor (44 mL). General instructions Get vitamin B12 injections if told to by your health care provider. Take supplements only as told by your health care provider. Follow the directions carefully. Keep all follow-up visits. This is important. Contact a health care provider if: Your symptoms come back. Your symptoms get worse or do not improve with treatment. Get help right away: You develop shortness of breath. You have a rapid heart rate. You have chest pain. You become dizzy or you faint. These symptoms may be an emergency. Get help right away. Call 911. Do not wait to see if the symptoms will go away. Do not drive yourself to the hospital. Summary Vitamin B12 deficiency occurs when the body does not have enough of this important vitamin. Common causes include not eating enough foods that contain vitamin B12, not being able to absorb vitamin B12 from the food that you eat, having a surgery in which part of the stomach or small intestine is removed, or taking certain medicines. Eat foods that have vitamin B12 in them. Treatment may include making a change in the way you eat and drink, getting vitamin B12 injections, or taking vitamin B12 supplements. This information is not intended to replace advice given to you by your health care provider. Make sure you discuss any questions you have with your health care provider. Document Revised: 06/08/2021 Document Reviewed: 06/08/2021 Elsevier Patient Education  2023 [employer]  

## 2023-01-07 ENCOUNTER — Encounter: Payer: Self-pay | Admitting: Oncology

## 2023-01-14 ENCOUNTER — Inpatient Hospital Stay: Payer: Medicare Other | Attending: Oncology

## 2023-01-14 ENCOUNTER — Other Ambulatory Visit: Payer: Self-pay

## 2023-01-14 DIAGNOSIS — E538 Deficiency of other specified B group vitamins: Secondary | ICD-10-CM | POA: Diagnosis not present

## 2023-01-14 DIAGNOSIS — D72819 Decreased white blood cell count, unspecified: Secondary | ICD-10-CM | POA: Insufficient documentation

## 2023-01-14 DIAGNOSIS — D696 Thrombocytopenia, unspecified: Secondary | ICD-10-CM | POA: Diagnosis present

## 2023-01-14 MED ORDER — CYANOCOBALAMIN 1000 MCG/ML IJ SOLN
1000.0000 ug | Freq: Once | INTRAMUSCULAR | Status: AC
  Administered 2023-01-14: 1000 ug via INTRAMUSCULAR
  Filled 2023-01-14: qty 1

## 2023-01-14 NOTE — Patient Instructions (Signed)

## 2023-07-19 ENCOUNTER — Encounter: Payer: Self-pay | Admitting: Oncology

## 2023-08-11 ENCOUNTER — Encounter: Payer: Self-pay | Admitting: Oncology

## 2023-08-18 ENCOUNTER — Inpatient Hospital Stay: Payer: Medicare HMO

## 2023-08-18 ENCOUNTER — Inpatient Hospital Stay: Payer: Medicare HMO | Attending: Oncology | Admitting: Oncology

## 2023-08-18 NOTE — Progress Notes (Deleted)
[facility]  [address] [place],  [place]  [address] [phone]  Clinic Day:  08/16/2022  Referring physician: Alveria Apley, NP  HISTORY OF PRESENT ILLNESS:  The patient is a 67 y.o. female  with a history of leukopenia and thrombocytopenia.  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  Of note, her hepatitis C was treated back in 2013.  Testing done at her last visit showed her to have an absent hepatitis C viral load.  She comes in today to reassess her counts after recent labs showed a platelet count of 97.   Since her last visit, the patient has been doing well.  As it relates to her leukopenia and thrombocytopenia, she denies having any spontaneous infections or bleeding/bruising issues, respectively, which concern her for worsening peripheral counts.  PHYSICAL EXAM:  There were no vitals taken for this visit. Wt Readings from Last 3 Encounters:  09/06/22 116 lb 1.9 oz (52.7 kg)  09/02/22 116 lb 1.9 oz (52.7 kg)  08/16/22 116 lb 11.2 oz (52.9 kg)   There is no height or weight on file to calculate BMI. Performance status (ECOG): 1 - Symptomatic but completely ambulatory Physical Exam Constitutional:      Appearance: Normal appearance. She is not ill-appearing.  HENT:     Mouth/Throat:     Mouth: Mucous membranes are moist.     Pharynx: Oropharynx is clear. No oropharyngeal exudate or posterior oropharyngeal erythema.  Cardiovascular:     Rate and Rhythm: Normal rate and regular rhythm.     Heart sounds: No murmur heard.    No friction rub. No gallop.  Pulmonary:     Effort: Pulmonary effort is normal. No respiratory distress.     Breath sounds: Normal breath sounds. No wheezing, rhonchi or rales.  Abdominal:     General: Bowel sounds are normal. There is no distension.     Palpations: Abdomen is soft. There is no mass.     Tenderness: There is no abdominal tenderness.  Musculoskeletal:         General: No swelling.     Right lower leg: No edema.     Left lower leg: No edema.  Lymphadenopathy:     Cervical: No cervical adenopathy.     Upper Body:     Right upper body: No supraclavicular or axillary adenopathy.     Left upper body: No supraclavicular or axillary adenopathy.     Lower Body: No right inguinal adenopathy. No left inguinal adenopathy.  Skin:    General: Skin is warm.     Coloration: Skin is not jaundiced.     Findings: No lesion or rash.  Neurological:     General: No focal deficit present.     Mental Status: She is alert and oriented to person, place, and time. Mental status is at baseline.  Psychiatric:        Mood and Affect: Mood normal.        Behavior: Behavior normal.        Thought Content: Thought content normal.    LABS:    Latest Reference Range & Units 08/16/22 00:00  WBC  3.7 (E)  RBC 3.87 - 5.11  4.13 (E)  Hemoglobin 12.0 - 16.0  12.3 (E)  HCT 36 - 46  35 ! (E)  Platelets 150 - 400 K/uL 99 ! (E)  !: Data is abnormal (E): External lab result    Latest Reference  Range & Units 08/16/22 14:52  Iron 28 - 170 ug/dL 409  UIBC ug/dL 811  TIBC 914 - 782 ug/dL 956  Saturation Ratios 10.4 - 31.8 % 41 (H)  Ferritin 11 - 307 ng/mL 73  Folate >5.9 ng/mL 8.4  Vitamin B12 180 - 914 pg/mL 111 (L)  (H): Data is abnormally high (L): Data is abnormally low    Latest Reference Range & Units 08/16/22 00:00  Sodium 137 - 147  136 ! (E)  Potassium 3.5 - 5.1 mEq/L 3.6 (E)  Chloride 99 - 108  105 (E)  CO2 13 - 22  25 ! (E)  Glucose  93 (E)  BUN 4 - 21  20 (E)  Creatinine 0.5 - 1.1  0.6 (E)  Calcium 8.7 - 10.7  9.4 (E)  Alkaline Phosphatase 25 - 125  41 (E)  Albumin 3.5 - 5.0  4.3 (E)  AST 13 - 35  24 (E)  ALT 7 - 35 U/L 18 (E)  !: Data is abnormal (E): External lab result ASSESSMENT & PLAN:  A 67 y.o. female with a history of both leukopenia and thrombocytopenia.  Her labs today are essentially unchanged versus what they have been previously.   She understands her blood counts are related to her baseline cirrhosis from hepatitis C.  When evaluating her other labs today, her vitamin B12 level came back low.  This level is in stark contrast to 881 it was earlier this year.  Nevertheless, as her labs now show evidence of B12 deficiency, I will place her on a protracted course of B12 injections to ensure adequate fortification of her cobalamin stores.  This will consist of her receiving 1000 mcg daily x 7 days, then weekly x 4 weeks, then monthly indefinitely. Otherwise, as per peripheral counts are essentially unchanged versus what they have been over these past few years, they will continue to be followed conservatively.  I will see her back in 1 year for repeat clinical assessment.  The patient understands all the plans discussed today and is in agreement with them.   Rani Sisney Kirby Funk, MD

## 2023-10-10 IMAGING — CT CT HIP*L* W/O CM
2 of 6 series · 13 of 46 positions shown, 18 images · non-contrast
Comparison: Pelvis and left hip radiographs 12/27/2021; CT left hip
12/27/2021

CLINICAL DATA: Fell about 1 month ago.  Continued left hip pain.

EXAM:
CT OF THE LEFT HIP WITHOUT CONTRAST
TECHNIQUE: Multidetector CT imaging of the left hip was performed according to
the standard protocol. Multiplanar CT image reconstructions were
also generated.
RADIATION DOSE REDUCTION: This exam was performed according to the
departmental dose-optimization program which includes automated
exposure control, adjustment of the mA and/or kV according to
patient size and/or use of iterative reconstruction technique.

[Series 5: axial st · axial · 0.40mm/px · z∈[-506,-336]mm · 10 of 99 slices shown, 15 images]
[im 7/99  soft-tissue]
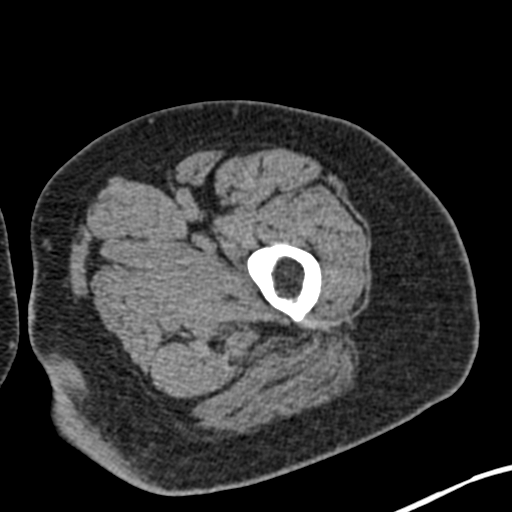
[im 7/99  bone]
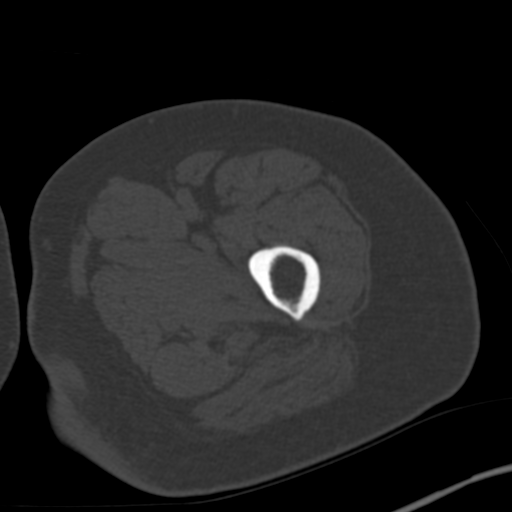
[im 20/99  soft-tissue]
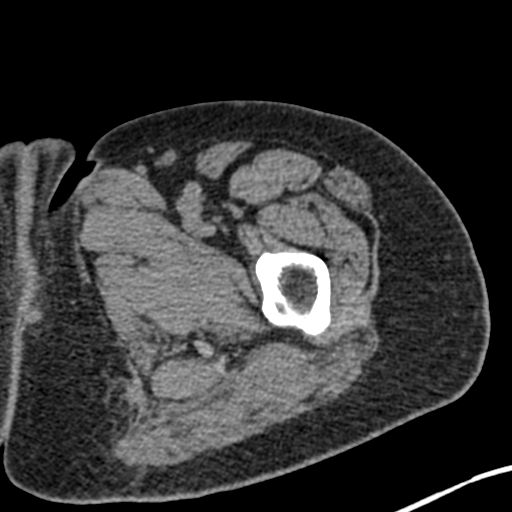
[im 27/99  soft-tissue]
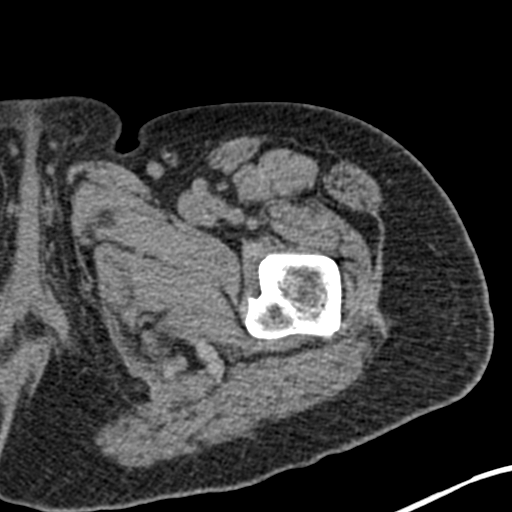
[im 40/99  soft-tissue]
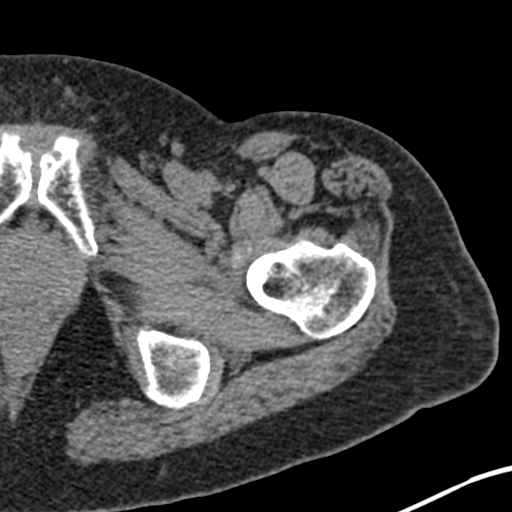
[im 53/99  soft-tissue]
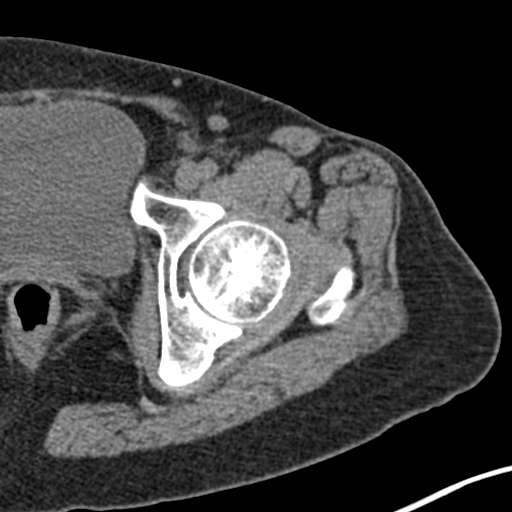
[im 59/99  soft-tissue]
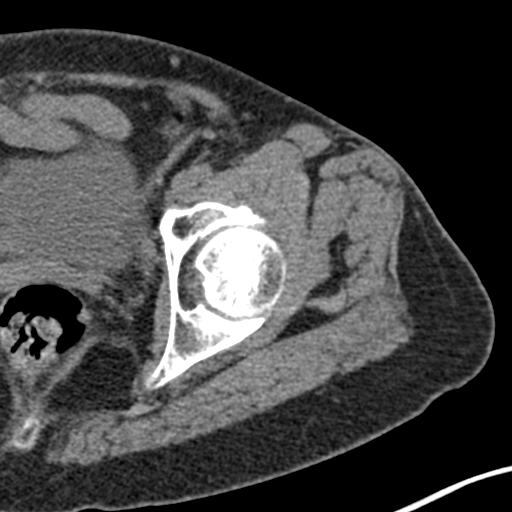
[im 72/99  soft-tissue]
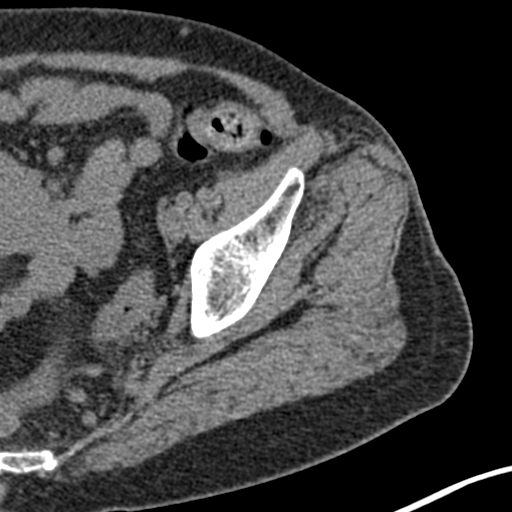
[im 72/99  lung]
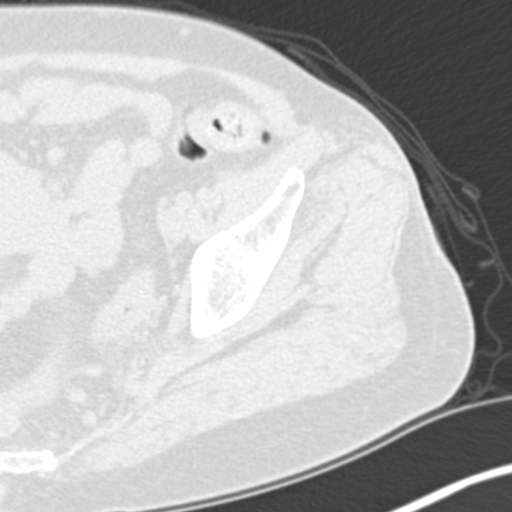
[im 79/99  soft-tissue]
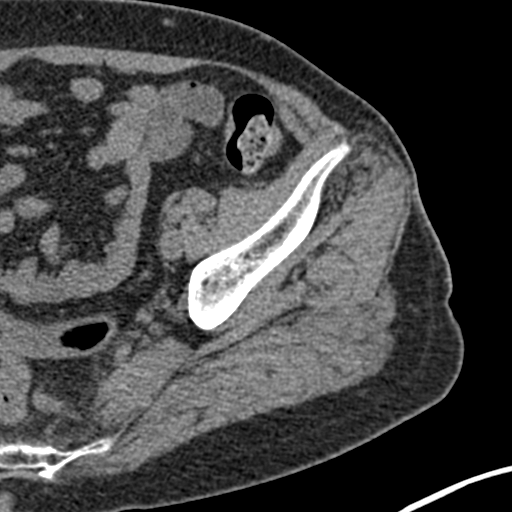
[im 79/99  lung]
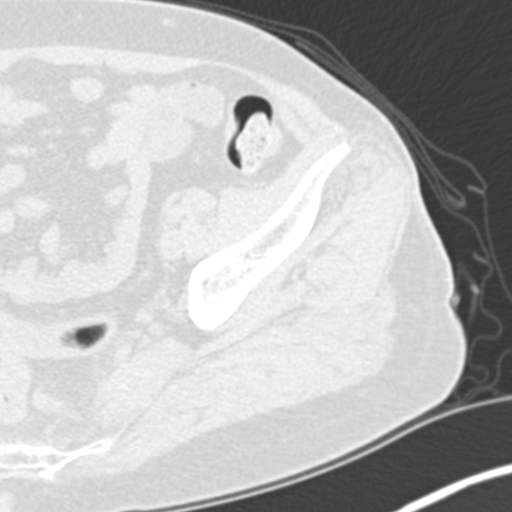
[im 85/99  lung]
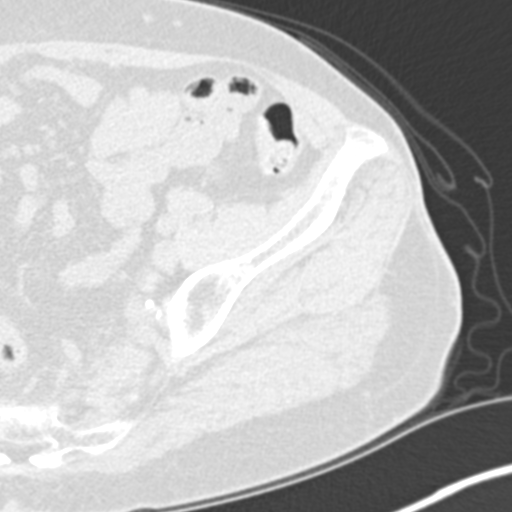
[im 92/99  soft-tissue]
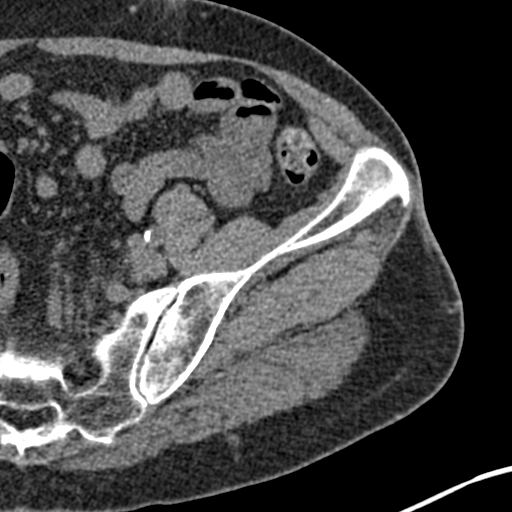
[im 92/99  lung]
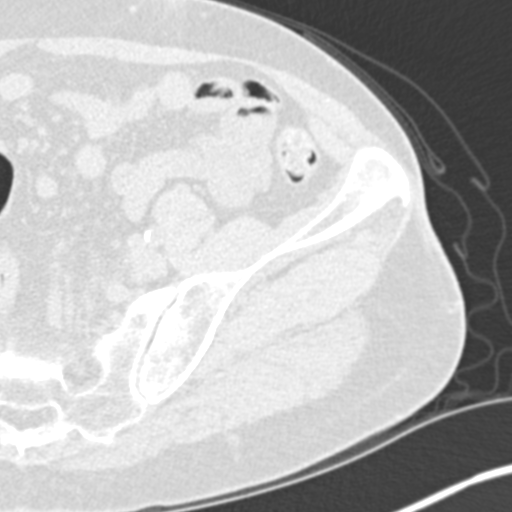
[im 92/99  bone]
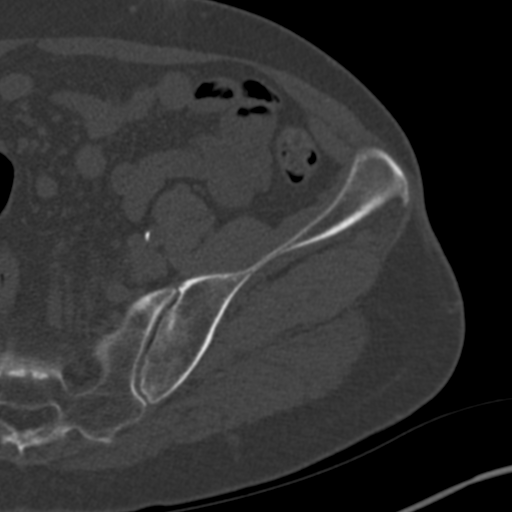

[Series 8: coronal st · coronal · 0.36mm/px · 3 of 97 slices shown]
[im 25/97  soft-tissue]
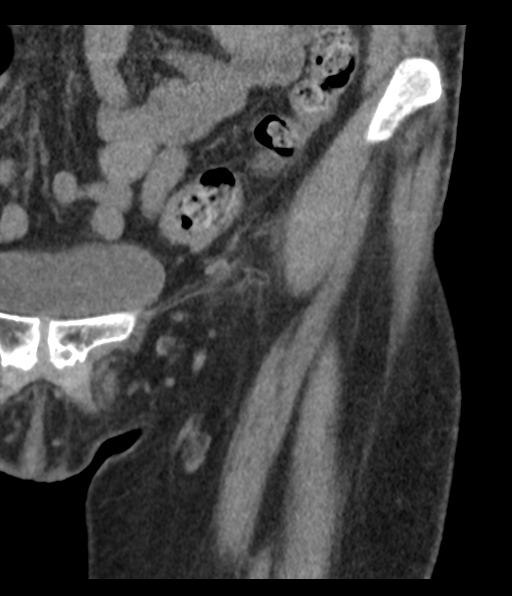
[im 49/97  soft-tissue]
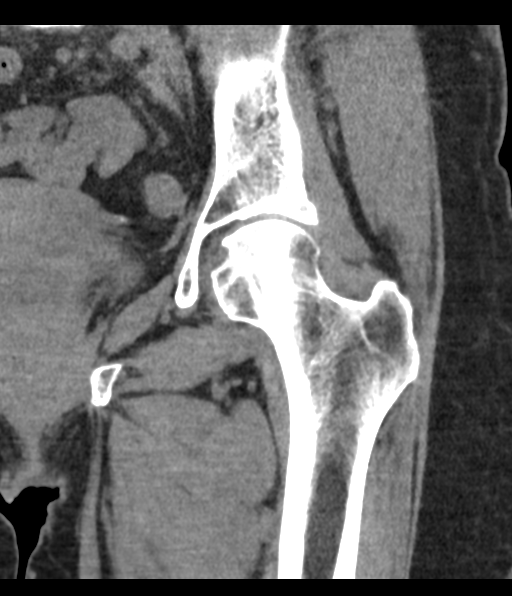
[im 73/97  soft-tissue]
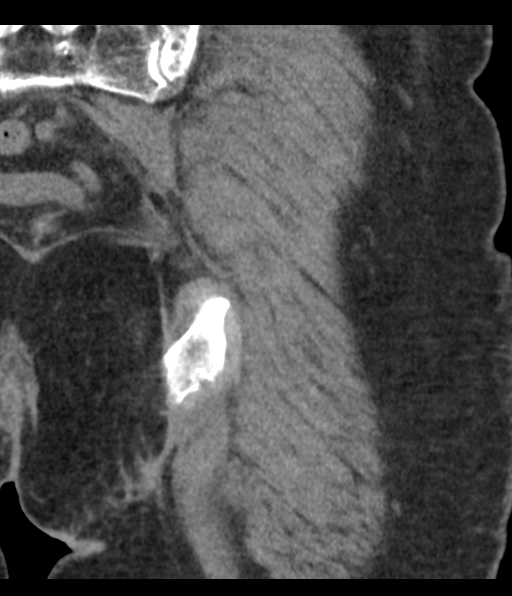

[13 of 46 positions shown; findings below may reference images not displayed]

FINDINGS: Bones/Joint/Cartilage

There is diffuse decreased bone mineralization. Within this
limitation, no acute fracture is seen. Only minimal left
femoroacetabular joint space narrowing. Mild superolateral left
acetabular and minimal left femoral head-neck junction degenerative
osteophytes.

Minimal pubic symphysis joint space narrowing and degenerative
articular cortical surface irregularity. Mild subchondral sclerosis
joint space narrowing, and vacuum phenomenon within the visualized
inferior left sacroiliac joint.

Ligaments

Suboptimally assessed by CT.

Muscles and Tendons

Normal size and density of the regional musculature.

Soft tissues

Resolution of the prior anterolateral subcutaneous fat hematoma at
the level of the left iliac crest and superior to the left
acetabulum.
IMPRESSION: :
IMPRESSION: 1. No acute fracture.
2. Minimal left femoroacetabular osteoarthritis.
3. Resolution of the prior mild anterolateral pelvis subcutaneous
fat hematoma.

## 2023-10-10 IMAGING — CR DG HIP (WITH OR WITHOUT PELVIS) 2-3V*R*
2 series · 2 of 2 positions shown · non-contrast
Comparison: 11/22/2021, 12/27/2021.

CLINICAL DATA: Right hip pain for 1-2 weeks.  Recent fall.

EXAM:
DG HIP (WITH OR WITHOUT PELVIS) 2-3V RIGHT

[t hip ap right]
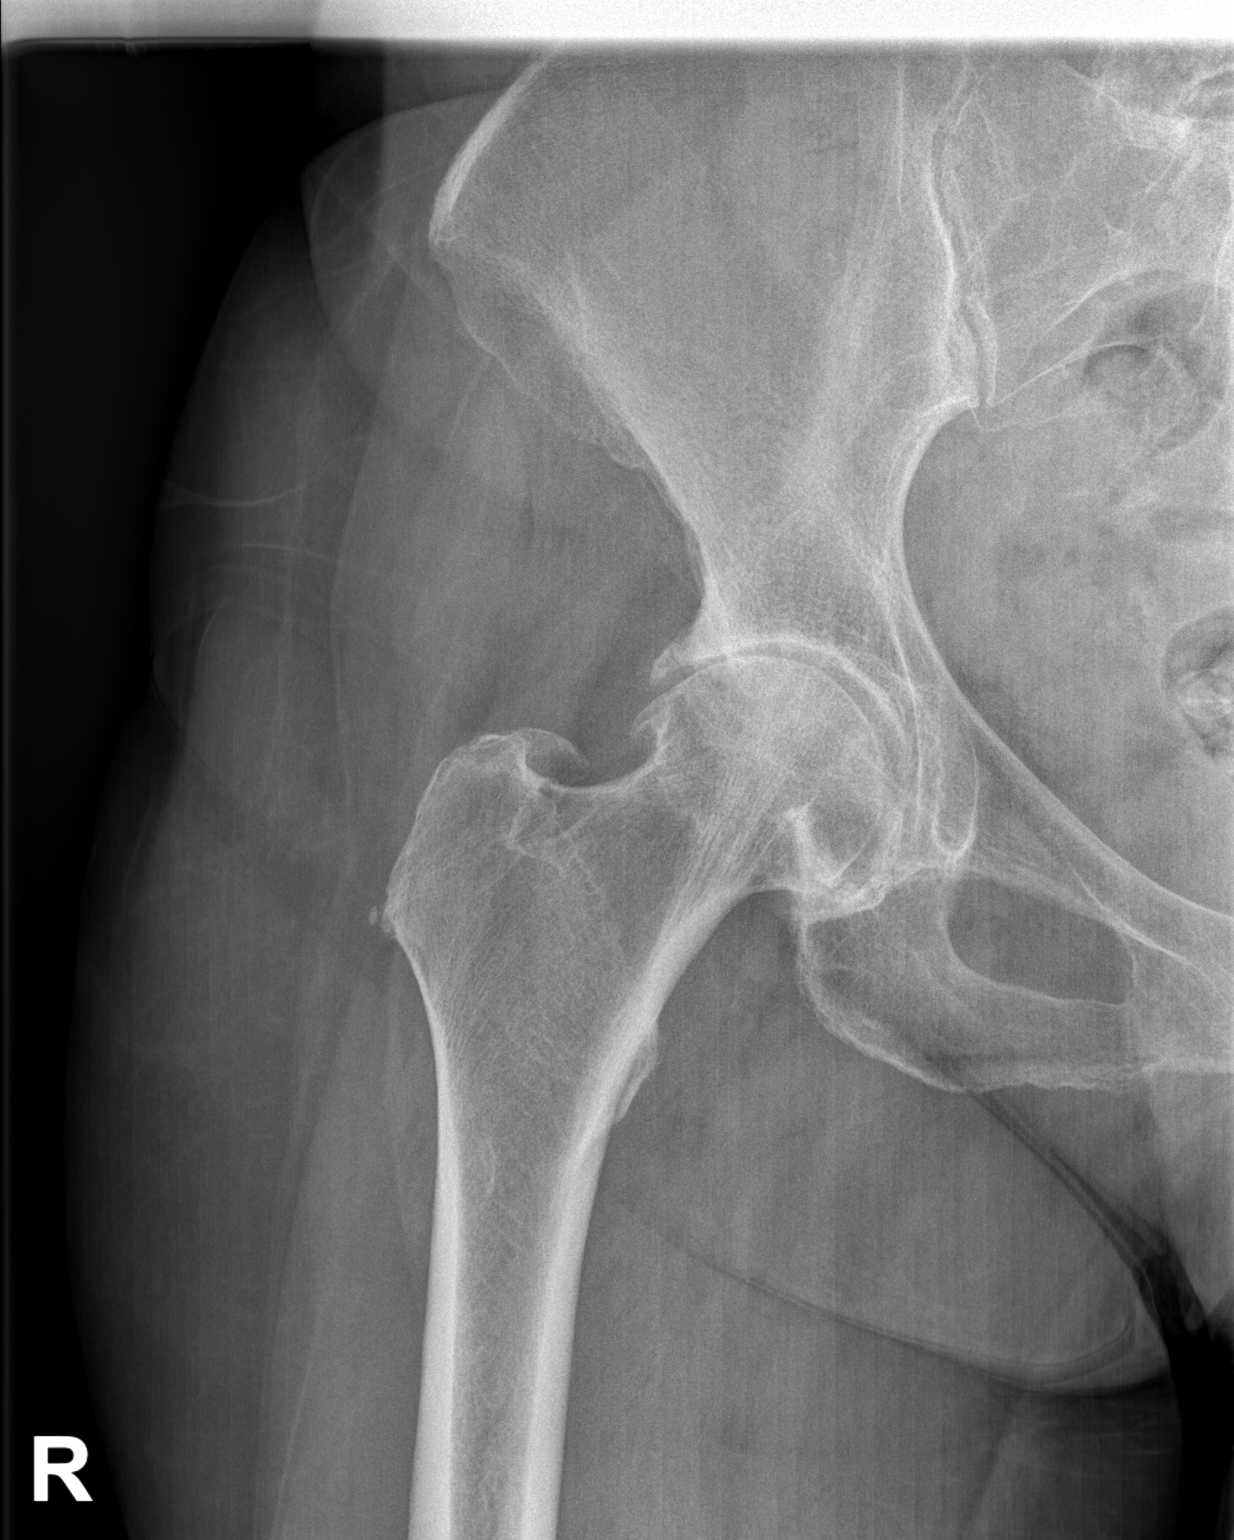

[t hip frog right]
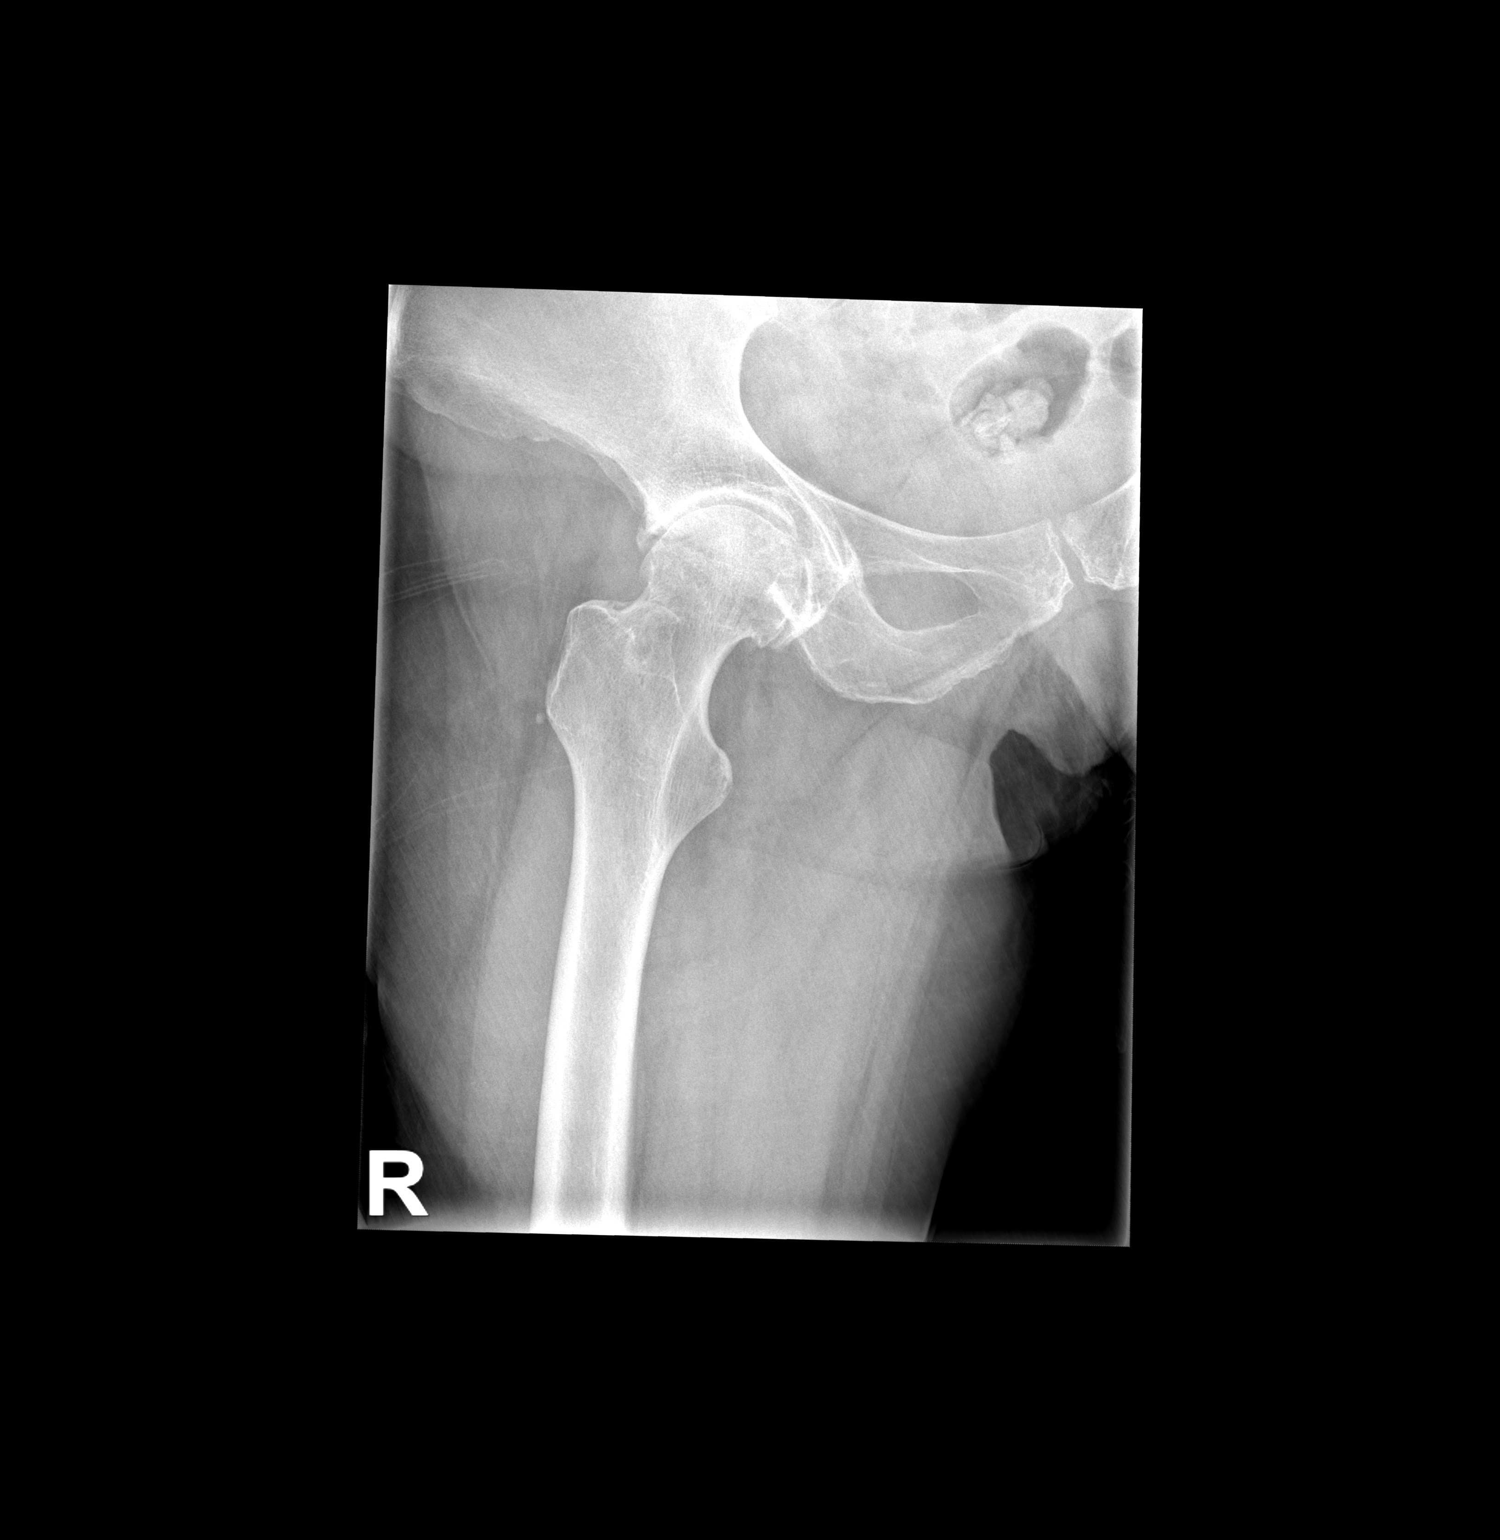

[2 of 2 positions shown; findings below may reference images not displayed]

FINDINGS: There is no evidence of hip fracture or dislocation. Moderate joint
space narrowing, subchondral sclerosis and osteophyte formation is
noted at the right hip.
IMPRESSION: 1. No acute fracture or dislocation.
2. Moderate degenerative changes at the right hip.

## 2024-03-05 ENCOUNTER — Other Ambulatory Visit: Payer: Self-pay

## 2024-03-23 ENCOUNTER — Other Ambulatory Visit: Payer: Self-pay

## 2024-08-12 ENCOUNTER — Other Ambulatory Visit: Payer: Self-pay | Admitting: Nurse Practitioner

## 2024-08-12 DIAGNOSIS — Z9189 Other specified personal risk factors, not elsewhere classified: Secondary | ICD-10-CM

## 2024-08-12 DIAGNOSIS — Z8673 Personal history of transient ischemic attack (TIA), and cerebral infarction without residual deficits: Secondary | ICD-10-CM

## 2024-08-13 ENCOUNTER — Encounter: Payer: Self-pay | Admitting: Oncology

## 2024-08-18 ENCOUNTER — Other Ambulatory Visit

## 2024-11-26 ENCOUNTER — Other Ambulatory Visit: Payer: Self-pay | Admitting: Nurse Practitioner

## 2024-11-26 DIAGNOSIS — Z9189 Other specified personal risk factors, not elsewhere classified: Secondary | ICD-10-CM

## 2024-11-26 DIAGNOSIS — Z8673 Personal history of transient ischemic attack (TIA), and cerebral infarction without residual deficits: Secondary | ICD-10-CM

## 2024-11-26 DIAGNOSIS — Z1231 Encounter for screening mammogram for malignant neoplasm of breast: Secondary | ICD-10-CM
